# Patient Record
Sex: Female | Born: 1940 | Race: White | Hispanic: No | Marital: Married | Smoking: Never smoker
Health system: Southern US, Community
[De-identification: ages and names within clinical notes are randomized; demographics above are authoritative.]

## PROBLEM LIST (undated history)

## (undated) DIAGNOSIS — Z1211 Encounter for screening for malignant neoplasm of colon: Secondary | ICD-10-CM

## (undated) DIAGNOSIS — M1990 Unspecified osteoarthritis, unspecified site: Secondary | ICD-10-CM

## (undated) DIAGNOSIS — E039 Hypothyroidism, unspecified: Secondary | ICD-10-CM

## (undated) DIAGNOSIS — R0789 Other chest pain: Secondary | ICD-10-CM

## (undated) DIAGNOSIS — E7800 Pure hypercholesterolemia, unspecified: Secondary | ICD-10-CM

## (undated) DIAGNOSIS — K219 Gastro-esophageal reflux disease without esophagitis: Secondary | ICD-10-CM

## (undated) HISTORY — DX: Other chest pain: R07.89

## (undated) HISTORY — DX: Pure hypercholesterolemia, unspecified: E78.00

## (undated) HISTORY — DX: Gastro-esophageal reflux disease without esophagitis: K21.9

## (undated) HISTORY — DX: Hypothyroidism, unspecified: E03.9

## (undated) HISTORY — DX: Encounter for screening for malignant neoplasm of colon: Z12.11

## (undated) HISTORY — DX: Unspecified osteoarthritis, unspecified site: M19.90

---

## 1967-10-15 DIAGNOSIS — Z5189 Encounter for other specified aftercare: Secondary | ICD-10-CM

## 1967-10-15 HISTORY — DX: Encounter for other specified aftercare: Z51.89

## 1994-08-31 ENCOUNTER — Emergency Department: Admit: 1994-08-31 | Payer: Self-pay | Admitting: Family Medicine

## 1994-09-01 ENCOUNTER — Emergency Department: Admit: 1994-09-01 | Payer: Self-pay | Admitting: Family Medicine

## 1996-10-12 ENCOUNTER — Emergency Department: Admit: 1996-10-12 | Payer: Self-pay | Source: Emergency Department | Admitting: Emergency Medicine

## 2001-01-02 ENCOUNTER — Ambulatory Visit: Admission: RE | Admit: 2001-01-02 | Payer: Self-pay | Source: Ambulatory Visit | Admitting: Gastroenterology

## 2006-08-28 ENCOUNTER — Ambulatory Visit: Admission: RE | Admit: 2006-08-28 | Payer: Self-pay | Source: Ambulatory Visit | Admitting: Gastroenterology

## 2011-08-01 NOTE — Op Note (Unsigned)
DATE OF BIRTH:                        07/30/1941      ADMISSION DATE:                     08/28/2006            PATIENT LOCATION:                     END END 39            DATE OF PROCEDURE:                   07/22/2006      SURGEON:                            Rossie Muskrat, MD      ASSISTANT(S):                  PREOPERATIVE DIAGNOSIS:  SCREENING.            POSTOPERATIVE DIAGNOSIS:  HEMORRHOIDS.            PROCEDURE:  COLONOSCOPY.            ANESTHESIA:  Versed 3.5 mg IV, fentanyl 75 mcg IV.            DESCRIPTION OF PROCEDURE:  After obtaining informed consent outlining the      risks, benefits and alternatives, and after digital rectal exam which      revealed no masses, the Olympus video 160 colonoscope was introduced into      the rectum.  Using a detorquing technique, it was advanced to the cecum      with visualization of the appendiceal orifice and ileocecal valve along      with intubation of the terminal ileum. The mucosal surfaces were well seen.      No lesions were noted in the cecum, ascending, transverse, or descending      colon.  A few small diverticula were noted in the sigmoid.  Retroflexion in      the rectal vault revealed hemorrhoids.  The instrument was removed and the      patient tolerated the procedure well.            Colonoscopic withdrawal time was 11 minutes.  A followup colonoscopy is      suggested in 5 years.                                                ___________________________________          Date Signed: __________      Rossie Muskrat, MD  ([id number])            D: 09/16/2006 by Rossie Muskrat, MD      T: 09/17/2006 by [id number] ([id number]) ([id number])      cc:  Rossie Muskrat, MD

## 2011-10-01 NOTE — Op Note (Signed)
MRN: [id number] DOCUMENT ID: [id number]      INTRODUCTION:      70 YEAR OLD FEMALE PATIENT PRESENTS FOR AN ELECTIVE OUTPATIENT      COLONOSCOPY.  THE INDICATION FOR THE PROCEDURE WAS AVERAGE RISK SCREENING      FOR COLON CANCER.      CONSENT:      THE BENEFITS, RISKS, AND ALTERNATIVES TO THE PROCEDURE WERE DISCUSSED AND      INFORMED CONSENT WAS OBTAINED.      PREPARATION:      EKG, PULSE, PULSE OXIMETRY AND BLOOD PRESSURE MONITORED.      MEDICATIONS:      ANESTHESIA-IVA      - MIDAZOLAM HCL 3.5 MG IV THROUGHOUT THE PROCEDURE      - FENTANYL 75 MCG IV THROUGHOUT THE PROCEDURE      PROCEDURE:      RECTAL EXAM: NORMAL SPHINCTER TONE.      THE ENDOSCOPE WAS PASSED WITH EASE UNDER DIRECT VISUALIZATION TO THE      TERMINAL ILEUM CONFIRMED BY LANDMARKS AND APPENDICEAL ORIFICE.      RETROFLEXION WAS PERFORMED IN THE RECTUM.  SCOPE WITHDRAWEL TIME FROM THE      CECUM WAS:9 MINUTES      FINDINGS:  INTERNAL HEMORRHOIDS WERE PRESENT.  THE COLONOSCOPY WAS      OTHERWISE NORMAL.      IMPRESSION:      1.  INTERNAL HEMORRHOIDS WERE PRESENT [455.0].      2.  COLONOSCOPY, OTHERWISE NORMAL.      SIGNING PHYSICIAN: Celedonio Sortino B

## 2012-03-10 ENCOUNTER — Telehealth: Payer: Self-pay

## 2012-03-10 NOTE — Telephone Encounter (Signed)
She called because she saw an [place] ad about BRCA testing in the [facility]. She is 70 and cancer free. Her sisters have been diagnosed with breast cancer at ages 22 and 11. She also has a cousin with pancreatic cancer. I told her that she does not meet medicare's criteria for BRCA testing. According to the [employer] model, her sisters have a 13% risk to have a BRCA mutation. Her sisters' insurance may cover BRCA testing at this risk level. She will talk this over with her sisters and will call back if she has any other questions.

## 2012-10-05 NOTE — Op Note (Unsigned)
DATE OF SURGERY:                    01/02/2001            SURGEON:                            Vernard Gambles, MD            ASSISTANT(S):                  PREOPERATIVE DIAGNOSIS:  CHANGE IN BOWEL HABITS.            POSTOPERATIVE DIAGNOSIS:  NORMAL COLONOSCOPY TO THE TERMINAL ILEUM.            PROCEDURE:  COLONOSCOPY.            MEDICATIONS:  Demerol 50 mg IV, Versed 3 mg IV.            DESCRIPTION OF PROCEDURE:  After informed  consent  was  obtained  from the      patient  including  all  benefits,  risks   and  alternatives  the  patient      understood and desired to proceed.  The  patient  was  placed  in  the left      lateral decubitus position and had introduction  of  pediatric colonoscope.      This was passed to the level of the cecum  with identification of ileocecal      valve and appendiceal orifice.  Pictures were taken.  The cecum, ascending,      transverse, descending and sigmoid and  rectum mucosa appeared normal.  The      rectum appeared normal on forward view and retroflexed view.            IMPRESSION:  Normal colonoscopy to the cecum.            RECOMMENDATION:   Repeat colonoscopy  five  years  time  for  colon  cancer      surveillance.                                                        _____________________________________                                            _____                                            Vernard Gambles, MD      PKG/mdirwm      D: 03/22/200211:12 A      T: 01/02/2001  2:20 P      J: [id number]      N: [id number]      CC: Vernard Gambles, MD         Shanna Cisco, MD

## 2014-08-14 HISTORY — PX: OTHER SURGICAL HISTORY: SHX169

## 2014-09-22 NOTE — Pre-Procedure Instructions (Signed)
   Pt called [phone] with question about eating seeds and nuts, called pt back and referred to surgeon

## 2014-09-23 ENCOUNTER — Ambulatory Visit: Payer: Medicare Other

## 2014-09-23 NOTE — Pre-Procedure Instructions (Signed)
PCP faxed for EKG done 07/2014, no labs ordered or required

## 2014-09-26 NOTE — Anesthesia Preprocedure Evaluation (Addendum)
Anesthesia Evaluation    AIRWAY    Mallampati: II    TM distance: >3 FB  Neck ROM: full  Mouth Opening:full   CARDIOVASCULAR    cardiovascular exam normal       DENTAL    no notable dental hx     PULMONARY    pulmonary exam normal     OTHER FINDINGS                      Anesthesia Plan    ASA 2     general                     Detailed anesthesia plan: general IV            informed consent obtained    ECG reviewed

## 2014-09-26 NOTE — Pre-Procedure Instructions (Signed)
Spoke with Hawkar at [facility] office who will fax ekg to [phone].

## 2014-09-27 ENCOUNTER — Ambulatory Visit: Payer: Medicare Other | Admitting: Anesthesiology

## 2014-09-27 ENCOUNTER — Ambulatory Visit
Admission: RE | Admit: 2014-09-27 | Discharge: 2014-09-27 | Disposition: A | Discharge status: Home / Self Care | Payer: Medicare Other | Source: Ambulatory Visit | Attending: Gastroenterology | Admitting: Gastroenterology

## 2014-09-27 ENCOUNTER — Encounter
Admission: RE | Disposition: A | Discharge status: Home / Self Care | Payer: Self-pay | Source: Ambulatory Visit | Attending: Gastroenterology

## 2014-09-27 ENCOUNTER — Ambulatory Visit: Payer: Medicare Other | Admitting: Gastroenterology

## 2014-09-27 DIAGNOSIS — K5730 Diverticulosis of large intestine without perforation or abscess without bleeding: Secondary | ICD-10-CM | POA: Insufficient documentation

## 2014-09-27 DIAGNOSIS — Z1211 Encounter for screening for malignant neoplasm of colon: Secondary | ICD-10-CM | POA: Insufficient documentation

## 2014-09-27 DIAGNOSIS — K649 Unspecified hemorrhoids: Secondary | ICD-10-CM | POA: Insufficient documentation

## 2014-09-27 HISTORY — PX: COLONOSCOPY: SHX174

## 2014-09-27 SURGERY — DONT USE, USE 1094-COLONOSCOPY, DIAGNOSTIC (SCREENING)
Anesthesia: Anesthesia General | Site: Anus

## 2014-09-27 MED ORDER — DIPHENHYDRAMINE HCL 50 MG/ML IJ SOLN
6.2500 mg | Freq: Four times a day (QID) | INTRAMUSCULAR | Status: DC | PRN
Start: 2014-09-27 — End: 2014-09-27

## 2014-09-27 MED ORDER — PROPOFOL INFUSION 10 MG/ML
INTRAVENOUS | Status: DC | PRN
Start: 2014-09-27 — End: 2014-09-27
  Administered 2014-09-27: 140 ug/kg/min via INTRAVENOUS

## 2014-09-27 MED ORDER — LACTATED RINGERS IV SOLN
INTRAVENOUS | Status: DC
Start: 2014-09-27 — End: 2014-09-27

## 2014-09-27 MED ORDER — LIDOCAINE HCL 2 % IJ SOLN
INTRAMUSCULAR | Status: DC | PRN
Start: 2014-09-27 — End: 2014-09-27
  Administered 2014-09-27: 40 mg

## 2014-09-27 MED ORDER — MEPERIDINE HCL 25 MG/ML IJ SOLN
25.0000 mg | INTRAMUSCULAR | Status: DC | PRN
Start: 2014-09-27 — End: 2014-09-27

## 2014-09-27 MED ORDER — PROPOFOL INFUSION 10 MG/ML
INTRAVENOUS | Status: DC | PRN
Start: 2014-09-27 — End: 2014-09-27
  Administered 2014-09-27: 50 mg via INTRAVENOUS

## 2014-09-27 MED ORDER — PROMETHAZINE HCL 25 MG/ML IJ SOLN
6.2500 mg | Freq: Once | INTRAMUSCULAR | Status: DC | PRN
Start: 2014-09-27 — End: 2014-09-27

## 2014-09-27 MED ORDER — ONDANSETRON HCL 4 MG/2ML IJ SOLN
4.0000 mg | Freq: Once | INTRAMUSCULAR | Status: DC | PRN
Start: 2014-09-27 — End: 2014-09-27

## 2014-09-27 SURGICAL SUPPLY — 20 items
CONTAINER SPEC LLDPE 16OZ W LF NS LEK (Procedure Accessories)
CONTAINER SPECIMAN 16OZ W LID (Procedure Accessories)
CONTAINER SPECIMEN C16 OZ WIDE LEAK SHATTER RESISTANT SNAP ON LID (Procedure Accessories) IMPLANT
FORCEP BIOPSY 240CM RADIAL JA (Instrument) IMPLANT
FORCEP BIOPSY 240CM RADIALJAW (Instrument) IMPLANT
GAUZE SPONGE 4X4 NS (Dressing) ×5
GLOVE NITRILE PREMIERPRO MED (Glove) ×2 IMPLANT
GOWN CP ELSTC WRIST REG/LG BL (Gown) ×1
GOWN ISL PP PE REG LG LF FULL BCK NK TIE (Gown) ×1
GOWN ISOLATION REGULAR LARGE FULL BACK NECK TIE ELASTIC CUFF (Gown) ×1 IMPLANT
JELLY KY LUBRICATNG 2 OZ FLIP (Procedure Accessories) ×2 IMPLANT
PAD ELECTROSRG GRND REM W CRD (Procedure Accessories) IMPLANT
SPONGE GAUZE L4 IN X W4 IN 16 PLY (Dressing) ×5
SPONGE GAUZE L4 IN X W4 IN 16 PLY MAXIMUM ABSORBENT USP TYPE VII (Dressing) ×5 IMPLANT
SYRINGE 50 ML GRADUATE NONPYROGENIC DEHP (Syringes, Needles) ×1
SYRINGE 50 ML GRADUATE NONPYROGENIC DEHP FREE PVC FREE BD MEDICAL (Syringes, Needles) ×1 IMPLANT
SYRINGE SLIP-TIP 60CC (Syringes, Needles) ×1
TUBING CONNECTING STERILE 10FT (Tubing) ×1
TUBING SUCTION ID3/16 IN L10 FT (Tubing) ×1
TUBING SUCTION ID3/16 IN L10 FT NONCONDUCTIVE STRAIGHT MALE FEMALE (Tubing) ×1 IMPLANT

## 2014-09-27 NOTE — Op Note (Signed)
GI PRE PROCEDURE NOTE    Proceduralist Comments:   Review of Systems and Past Medical / Surgical History performed: Yes     Indications:Colon cancer screening    Previous Adverse Reaction to Anesthesia or Sedation (if yes, describe): No    Physical Exam / Laboratory Data (If applicable)   Airway Classification: Class II    General: Alert and cooperative  Lungs: Lungs clear to auscultation  Cardiac: RRR, normal S1S2.    Abdomen: Soft, non tender. Normal active bowel sounds  Other:     No labs drawn    American Society of Anesthesiologists (ASA) Physical Status Classification:   ASA 2 - Patient with mild systemic disease with no functional limitations    Planned Sedation:   Deep sedation with anesthesia    Attestation:   Cristela Felt has been reassessed immediately prior to the procedure and is an appropriate candidate for the planned sedation and procedure. Risks, benefits and alternatives to the planned procedure and sedation have been explained to the patient or guardian:  yes        Signed by: Rossie Muskrat

## 2014-09-27 NOTE — Discharge Instructions (Signed)
Colonoscopy Discharge Instructions  General Instructions:  1. Following sedation, your judgement, perception, and coordination are considered impaired. Even though you may feel awake and alert, you are considered legally intoxicated. Therefore, until the next morning;   Do not Drive   Do not operate appliances or equipment that requires reaction time (e.g.Stove, electrical tools, machinery)   Do not sign legal documents or be involved in important decisions.   Do not smoke if alone   Do not drink alcoholic beverages   Go directly home and rest for several hours before resuming your routine activities.   It is highly recommended to have a responsible adult stay with you for the next 24 hours    2. Tenderness, swelling or pain may occur at the IV site where you received sedation. If you experience this, apply warm soaks to the area. Notify your physician if this persists.    Instructions Specific To Procedures - Report To Physician Any Of The Following:    Colon/Sigmoidoscopy/Proctoscopy   1. Severe and persistent abdominal pain/bloating which does not subside within 2-3 hours   2. Large amount of rectal bleeding (some mucosal blood streaking may occur, especially if biopsy or polypectomy was done or if hemorrhoids are present.   3. Nausea/vomitting   4. Fevers/Chills within 24 hours after procedure. Temp>101deg F     In Addition:   If polyp has been removed, DO NOT take aspirin or aspirin containing products (e.g. Anacin, Alka Seltzer, Bufferin, Etc.) or non-steroidal anti-inflammatory drugs (e.g. Advil, Motrin, etc.) for *** days unless otherwise advised by doctor. Tylenol  or extra Strength Tylenol is permitted.    Additional Discharge Instructions  Your diet after the procedure:  Start with something light (Toast, Jello, Soup, Etc.), Then Resume to Regular Diet as Tolerated. Nothing Spicy, Greasy or [employer] Today.  If you have questions or problems contact your MD immediately. If you need immediate  attention, call your MD, 911 and/or go to nearest emergency room.      Understanding Diverticulosis and Diverticulitis  The colon (large intestine) is the last part of the digestive tract. It absorbs water from stool and changes it from a liquid to a solid. In certain cases, small pouches called diverticula can form in the colon wall. This condition is called diverticulosis. The pouches can become infected. If this happens, it becomes a more serious problem called diverticulitis. These problems can be painful. But they can be managed.    Managing your condition  Diet changes or medications may be prescribed.    If you have diverticulosis  2. Diet changes are often enough to control symptoms. The main changes are adding fiber (roughage) and drinking more water. Fiber absorbs water as it travels through your colon. This helps your stool stay soft and move smoothly. Water helps this process.  3. If needed, you may be told to take over-the-counter stool softeners.  4. To help relieve pain, antispasmodic medications may be prescribed.  5. Watch for changes in your bowel movements. Tell the doctor if you notice any changes.  6. Begin an exercise program. Ask your doctor how to get started.  7. Get plenty of rest and sleep.  If you have diverticulitis  Treatment depends on how bad your symptoms are.   For mild symptoms. You may be put on a liquid diet for a short time. Antibiotics are usually prescribed. If these2 steps relieve your symptoms, you may then be prescribed a high-fiber diet. If you still have symptoms,  your doctor will discuss further treatment options with you.   For severe symptoms. You may need to be admitted to the hospital. There, you can be given IV antibiotics and fluids. You will also be put on a low-fiber or liquid diet. Although not common, surgery is needed in some people with severe symptoms.  Keys to colon health  Help keep your colon healthy with a diet that includes plenty of high-fiber  fruits, vegetables, and whole grains. Drink plenty of liquids like water and juice.   [address] [employer] [address], [place], [place] [address]. All rights reserved. This information is not intended as a substitute for professional medical care. Always follow your healthcare professional's instructions.      Hemorrhoids,External    A hemorrhoid is a local swelling of the veins around the rectum. These most often occur from repeated forceful straining during bowel movements or heavy lifting. It may also occur in the last few months of pregnancy. A hemorrhoid feels like a soft lump. It may itch from time to time. When it is inflamed it becomes hard and very painful.  Home Care:  8. SITZ BATHS: Sit in a tub filled with about 6 inches of hot water. Allow the water to run in order to keep it hot for a total of 10-15 minutes. Repeat this three times a day until pain is relieved.  9. Keep your stools soft to avoid the need to strain when having a bowel movement. Unless another medicine was prescribed, try the following:  IF YOU ARE CONSTIPATED: You may use over-the-counter laxatives such as MILK OF MAGNESIA (mild acting) or, DULCOLAX (if stronger action is needed).  IF YOU ARE NOT CONSTIPATED but stools are hard, try taking Colace (docusate sodium) which is a stool softener. This will soften stools without producing diarrhea. Drinking extra fluids may also help.  10. The use of creams applied to the hemorrhoid itself, such as ANUSOL or PREPARATION H, will be helpful to reduce pain and itching, and speed healing.  Prevention:  Avoid straining on the toilet by keeping stools soft. Increasing FIBER in your diet (fruits, cereals, vegetables and grains) will promote healthy bowel movement. If this is not working, you may use METAMUCIL and similar products. These are over-the-counter fiber supplements. You must drink extra fluids when taking these to avoid constipation.  Follow Up  with your doctor if you  do not begin to respond to the above treatment within the next few days.  Get Prompt Medical Attention  if any of the following occur:   Large amount of rectal bleeding (more than 1 cup of blood in 24 hours)   Increasing rectal pain or rectal pain that continues for more than three days of treatment   Weakness, dizziness or fainting   Vomiting blood (red or black color)   2000-2015 [employer] [address] [place], [place] [address]. All rights reserved. This information is not intended as a substitute for professional medical care. Always follow your healthcare professional's instructions.      Understanding Colon and Rectal Polyps  The colon (also called the large intestine) is a muscular tube that forms the last part of the digestive tract. It absorbs water and stores food waste. The colon is about 4 to 6 feet long. The rectum is the last 6 inches of the colon. The colon and rectum have a smooth lining composed of millions of cells. Changes in these cells can lead to growths  in the colon that can become cancerous and should be removed.  When the Colon Lining Changes  Changes that occur in the cells that line the colon or rectum can lead to growths called polyps. Over a period of years, polyps can turn cancerous. Removing polyps early may prevent cancer from ever forming.     Polyps  Polyps are fleshy clumps of tissue that form on the lining of the colon or rectum. Small polyps are usually benign (not cancerous). However, over time, cells in a polyp can change and become cancerous. Certain types of polyps known as adenomatous polyps are premalignant. The risk for invasive cancer increases with the size of the polyp and certain cell and gene features. This means that they can become cancerous if they're not removed.Hyperplastic polyps are benign. They can grow quite large and not turn cancerous.  Cancer  Almost all colorectal cancers start when polyp cells begin growing abnormally. As a  cancerous tumor grows, it may involve more and more of the colon or rectum. In time, cancer can also grow beyond the colon or rectum and spread to nearby organs or to glands called lymph nodes. The cells can also travel to other parts of the body. This is known as metastasis. The earlier a cancerous tumor is removed, the better the chance of preventing its spread.   [address] [employer] [address], [place], [place] [address]. All rights reserved. This information is not intended as a substitute for professional medical care. Always follow your healthcare professional's instructions.

## 2014-09-27 NOTE — Transfer of Care (Signed)
Anesthesia Transfer of Care Note    Patient: Tracy Ritter    Procedures performed: Procedure(s) with comments:  COLONOSCOPY - COLONOSCOPY    Anesthesia type: General TIVA    Patient location:Phase II PACU    Last vitals:   Filed Vitals:    09/27/14 1254   BP: 130/59   Pulse: 66   Temp: 37.2 C (99 F)   Resp: 16       Post pain: Patient not complaining of pain, continue current therapy      Mental Status:awake    Respiratory Function: tolerating room air    Cardiovascular: stable    Nausea/Vomiting: patient not complaining of nausea or vomiting    Hydration Status: adequate    Post assessment: no apparent anesthetic complications

## 2014-09-27 NOTE — Anesthesia Postprocedure Evaluation (Signed)
Anesthesia Post Evaluation    Patient: Tracy Ritter    Procedures performed: Procedure(s) with comments:  COLONOSCOPY - COLONOSCOPY    Anesthesia type: General TIVA    Patient location:Phase II PACU    Last vitals:   Filed Vitals:    09/27/14 1254   BP: 130/59   Pulse: 66   Temp: 37.2 C (99 F)   Resp: 16       Post pain: Patient not complaining of pain, continue current therapy      Mental Status:awake and alert     Respiratory Function: tolerating room air    Cardiovascular: stable    Nausea/Vomiting: patient not complaining of nausea or vomiting    Hydration Status: adequate    Post assessment: no apparent anesthetic complications

## 2014-09-28 ENCOUNTER — Encounter: Payer: Self-pay | Admitting: Gastroenterology

## 2014-09-28 NOTE — Op Note (Signed)
Procedure Date: 09/27/2014     Patient Type: A     SURGEON: Rossie Muskrat MD  ASSISTANT:       PREOPERATIVE DIAGNOSIS:  Screening.     POSTOPERATIVE DIAGNOSES:  1.  Diverticulosis, sigmoid colon.  2.  Hemorrhoids.     TITLE OF PROCEDURE:  Colonoscopy.     ANESTHESIA:  LMAC.     DESCRIPTION OF PROCEDURE:  After obtaining informed consent, outlining risks, benefits and  alternatives and after digital exam which revealed no masses, the Olympus  video 180 colonoscope was introduced into the rectum with detorquing  technique was advanced to the cecum with visualization of the appendiceal  orifice and ileocecal valve with glimpse of the ileum obtained.  The  ascending colon was traversed x2.  Findings were that of a normal cecum,  ascending, transverse, and descending colon.  There was some angulation in  the sigmoid with a few diverticula.  Retroflexion in the rectal vault  revealed hemorrhoids.  The instrument was removed and the patient tolerated  the procedure well.     RECOMMENDATIONS:  1.  A 25 gram fiber diet.  2.  Followup colonoscopy in ten years.           D:  09/27/2014 13:54 PM by Dr. Vicente Serene B. Chase Picket, MD [phone])  T:  09/27/2014 20:44 PM by       Everlean Cherry: [id number]) (Doc ID: [id number])

## 2015-06-06 DIAGNOSIS — Z961 Presence of intraocular lens: Secondary | ICD-10-CM | POA: Diagnosis not present

## 2015-06-06 DIAGNOSIS — H40013 Open angle with borderline findings, low risk, bilateral: Secondary | ICD-10-CM | POA: Diagnosis not present

## 2015-06-07 DIAGNOSIS — Z124 Encounter for screening for malignant neoplasm of cervix: Secondary | ICD-10-CM | POA: Diagnosis not present

## 2015-07-11 DIAGNOSIS — R070 Pain in throat: Secondary | ICD-10-CM | POA: Diagnosis not present

## 2015-07-11 DIAGNOSIS — J019 Acute sinusitis, unspecified: Secondary | ICD-10-CM | POA: Diagnosis not present

## 2015-08-01 DIAGNOSIS — E039 Hypothyroidism, unspecified: Secondary | ICD-10-CM | POA: Diagnosis not present

## 2015-08-01 DIAGNOSIS — D693 Immune thrombocytopenic purpura: Secondary | ICD-10-CM | POA: Diagnosis not present

## 2015-08-01 DIAGNOSIS — Z1231 Encounter for screening mammogram for malignant neoplasm of breast: Secondary | ICD-10-CM | POA: Diagnosis not present

## 2015-08-01 DIAGNOSIS — Z0000 Encounter for general adult medical examination without abnormal findings: Secondary | ICD-10-CM | POA: Diagnosis not present

## 2015-08-01 DIAGNOSIS — E782 Mixed hyperlipidemia: Secondary | ICD-10-CM | POA: Diagnosis not present

## 2015-08-01 DIAGNOSIS — Z0001 Encounter for general adult medical examination with abnormal findings: Secondary | ICD-10-CM | POA: Diagnosis not present

## 2015-08-01 DIAGNOSIS — Z719 Counseling, unspecified: Secondary | ICD-10-CM | POA: Diagnosis not present

## 2015-08-01 DIAGNOSIS — Z1389 Encounter for screening for other disorder: Secondary | ICD-10-CM | POA: Diagnosis not present

## 2015-08-28 DIAGNOSIS — L821 Other seborrheic keratosis: Secondary | ICD-10-CM | POA: Diagnosis not present

## 2015-08-28 DIAGNOSIS — D2371 Other benign neoplasm of skin of right lower limb, including hip: Secondary | ICD-10-CM | POA: Diagnosis not present

## 2015-11-22 DIAGNOSIS — J019 Acute sinusitis, unspecified: Secondary | ICD-10-CM | POA: Diagnosis not present

## 2015-11-22 DIAGNOSIS — R070 Pain in throat: Secondary | ICD-10-CM | POA: Diagnosis not present

## 2015-11-28 ENCOUNTER — Other Ambulatory Visit: Payer: Self-pay | Admitting: Family Medicine

## 2015-11-28 DIAGNOSIS — J9811 Atelectasis: Secondary | ICD-10-CM | POA: Diagnosis not present

## 2015-11-28 DIAGNOSIS — R05 Cough: Secondary | ICD-10-CM | POA: Diagnosis not present

## 2016-04-10 ENCOUNTER — Encounter (INDEPENDENT_AMBULATORY_CARE_PROVIDER_SITE_OTHER): Payer: Self-pay | Admitting: Family Medicine

## 2016-04-10 ENCOUNTER — Ambulatory Visit (INDEPENDENT_AMBULATORY_CARE_PROVIDER_SITE_OTHER): Payer: Medicare Other

## 2016-04-10 ENCOUNTER — Ambulatory Visit (INDEPENDENT_AMBULATORY_CARE_PROVIDER_SITE_OTHER): Payer: Medicare Other | Admitting: Family Medicine

## 2016-04-10 VITALS — BP 125/74 | HR 63 | Temp 98.0°F | Resp 15 | Ht 63.0 in | Wt 135.0 lb

## 2016-04-10 DIAGNOSIS — S99911A Unspecified injury of right ankle, initial encounter: Secondary | ICD-10-CM

## 2016-04-10 NOTE — Progress Notes (Signed)
[facility]  PROGRESS NOTE     Patient: Tracy Ritter   Date: 04/10/2016   MRN: [id number]       Tracy Ritter is a 75 y.o. female      SUBJECTIVE     Chief Complaint   Patient presents with   . Ankle Injury     Pt fell today at the pool and hurt right ankle at 5:15pm. Pt has right ankle on ice and she is currently in a wheelchair. She said she also tried to keep her right ankle elevated to releive pain. Pt last took Advil 1 hour prior to arrival.          Ankle Injury   The incident occurred 1 to 3 hours ago. The incident occurred at the pool. The injury mechanism was a fall. The pain is present in the right ankle. The quality of the pain is described as aching and shooting. The pain is at a severity of 6/10. The pain has been constant since onset. Pertinent negatives include no numbness or tingling. She reports no foreign bodies present. The symptoms are aggravated by weight bearing, palpation and movement. She has tried ice and NSAIDs for the symptoms. The treatment provided no relief.       Review of Systems   Constitutional: Negative for fever and chills.   Respiratory: Negative for shortness of breath.    Cardiovascular: Negative for chest pain.   Neurological: Negative for tingling and numbness.       The following portions of the patient's history were reviewed and updated as appropriate: Allergies, Current Medications, Past Family History, Past Medical history, Past social history, Past surgical history, and Problem List.    OBJECTIVE     Vitals   Filed Vitals:    04/10/16 1852   BP: 125/74   Pulse: 63   Temp: 98 F (36.7 C)   TempSrc: Tympanic   Resp: 15   Height: 1.6 m (5\' 3" )   Weight: 61.236 kg (135 lb)   SpO2: 97%       Physical Exam   Constitutional: She appears well-developed and well-nourished.   Eyes: Conjunctivae are normal.   Musculoskeletal:        Right knee: Normal.        Right ankle: She exhibits decreased range of motion and swelling. Tenderness. Lateral malleolus and medial  malleolus tenderness found. Achilles tendon normal.        Right lower leg: Normal.        Right foot: Normal.   Skin: Skin is warm and dry.       Lab Results (24 Hour)   Results     ** No results found for the last 24 hours. **          Radiology Results (24 Hour)     Procedure Component Value Units Date/Time    X-ray Ankle Right  3 Views [[id number]] Collected:  04/10/16 1919    Order Status:  Completed Updated:  04/10/16 1924    Narrative:      INDICATION:  Right ankle injury.    TECHNIQUE:  3 views    COMPARISON:No relevant prior studies were available for review.    FINDINGS:  There is soft tissue swelling over the lateral malleolus.  There is a suspicion for a small nondisplaced fracture through the  lateral malleolus. No additional bony abnormalities noted. No joint  space abnormalities evident.      Impression:  Soft tissue ligamentous injury and a questionable  nondisplaced fracture through the lateral malleolus.    Lorenda Peck, MD   04/10/2016 7:20 PM            ASSESSMENT     Encounter Diagnosis   Name Primary?   . Right ankle injury, initial encounter Yes          PLAN     Procedures    1. Right ankle injury, initial encounter  - X-ray Ankle Right  3 Views  - Task for Ace Wrap (3 or 4 IN)  - Ambulatory referral to Orthopedic Surgery  - Task for Air Stirrup  RICE  -Patient to follow up with primary care doctor or follow up here if symptoms worsening or not resolving as expected.   -Follow up with ED with acute worsening of symptoms.  -Patient verbalized understanding.   -Reviewed discharge instructions that are included here with patient, and printed in AVS.   -Questions answered.  Risks and Benefits of Therapy Discussed      An After Visit Summary was printed and given to the patient.      Signed,  Britta Mccreedy, MD  04/10/2016

## 2016-04-10 NOTE — Patient Instructions (Signed)
Treating Ankle Fractures  Treatment of an ankle fracture may be surgical or non-surgical, depending on where and how badly your ankle has been broken.  Some stable ankle fractures may be treated in a walking boot. These fractures are stable and will heal without additional treatment. You may be able to start walking on your ankle as soon as the pain improves.  Some fractures may require cast treatment.  A cast may be used to hold the broken bone in its proper position for healing. Sometimes the sections of broken bone must first be realigned. This is done by a process known as reduction. The type of reduction is based on how far the bone has moved from its normal position.     Sites of common ankle fractures    Closed reduction  If you have a clean break with little soft tissue damage, closed reduction will probably be used. Before the procedure, you may be given a light anesthetic to relax your muscles. Then your doctor manually readjusts the position of the broken bone.  Open reduction  If you have an open fracture (bone sticking out through the skin), badly misaligned sections of bone, or severe tissue injury, open reduction is likely. A general anesthetic may be used during the procedure to let you sleep and relax your muscles. Your doctor then makes one or more incisions to realign the bone and repair soft tissue. Screws or plates may be used to hold the bone in place during healing.    Casting the fracture  To make sure the bone is aligned properly, an X-ray is taken. Then the ankle is put in a cast to hold the bone in place during healing. You'll probably have to wear the cast for severalweeks. For less severe fractures, a walking boot, brace, or splint may be all that's needed to hold the bone in place during healing.  The road to healing  Once your fracture has been treated, your doctor will tell you how to help it heal. You may be told to limit ankle use or weight-bearing activities, take medicines,  and elevate the foot. If you have a cast, remember to keep it dry.   Date Last Reviewed: 06/22/2014   2000-2016 [employer] [address] [place], [place] [address]. All rights reserved. This information is not intended as a substitute for professional medical care. Always follow your healthcare professional's instructions.

## 2016-04-11 DIAGNOSIS — M159 Polyosteoarthritis, unspecified: Secondary | ICD-10-CM | POA: Diagnosis not present

## 2016-04-11 DIAGNOSIS — E538 Deficiency of other specified B group vitamins: Secondary | ICD-10-CM | POA: Diagnosis not present

## 2016-04-11 DIAGNOSIS — E039 Hypothyroidism, unspecified: Secondary | ICD-10-CM | POA: Diagnosis not present

## 2016-04-11 DIAGNOSIS — R413 Other amnesia: Secondary | ICD-10-CM | POA: Diagnosis not present

## 2016-04-11 DIAGNOSIS — E782 Mixed hyperlipidemia: Secondary | ICD-10-CM | POA: Diagnosis not present

## 2016-04-11 DIAGNOSIS — S8290XA Unspecified fracture of unspecified lower leg, initial encounter for closed fracture: Secondary | ICD-10-CM | POA: Diagnosis not present

## 2016-04-12 DIAGNOSIS — M2012 Hallux valgus (acquired), left foot: Secondary | ICD-10-CM | POA: Diagnosis not present

## 2016-04-12 DIAGNOSIS — M205X9 Other deformities of toe(s) (acquired), unspecified foot: Secondary | ICD-10-CM | POA: Diagnosis not present

## 2016-04-12 DIAGNOSIS — S93401A Sprain of unspecified ligament of right ankle, initial encounter: Secondary | ICD-10-CM | POA: Diagnosis not present

## 2016-04-12 DIAGNOSIS — M2011 Hallux valgus (acquired), right foot: Secondary | ICD-10-CM | POA: Diagnosis not present

## 2016-05-06 DIAGNOSIS — S93401D Sprain of unspecified ligament of right ankle, subsequent encounter: Secondary | ICD-10-CM | POA: Diagnosis not present

## 2016-05-06 DIAGNOSIS — M25571 Pain in right ankle and joints of right foot: Secondary | ICD-10-CM | POA: Diagnosis not present

## 2016-05-14 DIAGNOSIS — M6281 Muscle weakness (generalized): Secondary | ICD-10-CM | POA: Diagnosis not present

## 2016-05-14 DIAGNOSIS — R2689 Other abnormalities of gait and mobility: Secondary | ICD-10-CM | POA: Diagnosis not present

## 2016-05-14 DIAGNOSIS — M25571 Pain in right ankle and joints of right foot: Secondary | ICD-10-CM | POA: Diagnosis not present

## 2016-05-14 DIAGNOSIS — S93401D Sprain of unspecified ligament of right ankle, subsequent encounter: Secondary | ICD-10-CM | POA: Diagnosis not present

## 2016-05-15 DIAGNOSIS — R2689 Other abnormalities of gait and mobility: Secondary | ICD-10-CM | POA: Diagnosis not present

## 2016-05-15 DIAGNOSIS — S93401D Sprain of unspecified ligament of right ankle, subsequent encounter: Secondary | ICD-10-CM | POA: Diagnosis not present

## 2016-05-15 DIAGNOSIS — M25571 Pain in right ankle and joints of right foot: Secondary | ICD-10-CM | POA: Diagnosis not present

## 2016-05-15 DIAGNOSIS — M6281 Muscle weakness (generalized): Secondary | ICD-10-CM | POA: Diagnosis not present

## 2016-05-16 DIAGNOSIS — H52223 Regular astigmatism, bilateral: Secondary | ICD-10-CM | POA: Diagnosis not present

## 2016-05-16 DIAGNOSIS — H40013 Open angle with borderline findings, low risk, bilateral: Secondary | ICD-10-CM | POA: Diagnosis not present

## 2016-05-16 DIAGNOSIS — Z961 Presence of intraocular lens: Secondary | ICD-10-CM | POA: Diagnosis not present

## 2016-05-16 DIAGNOSIS — Z83518 Family history of other specified eye disorder: Secondary | ICD-10-CM | POA: Diagnosis not present

## 2016-05-24 DIAGNOSIS — R2689 Other abnormalities of gait and mobility: Secondary | ICD-10-CM | POA: Diagnosis not present

## 2016-05-24 DIAGNOSIS — M6281 Muscle weakness (generalized): Secondary | ICD-10-CM | POA: Diagnosis not present

## 2016-05-24 DIAGNOSIS — S93401D Sprain of unspecified ligament of right ankle, subsequent encounter: Secondary | ICD-10-CM | POA: Diagnosis not present

## 2016-05-24 DIAGNOSIS — M25571 Pain in right ankle and joints of right foot: Secondary | ICD-10-CM | POA: Diagnosis not present

## 2016-05-27 DIAGNOSIS — M6281 Muscle weakness (generalized): Secondary | ICD-10-CM | POA: Diagnosis not present

## 2016-05-27 DIAGNOSIS — S93401D Sprain of unspecified ligament of right ankle, subsequent encounter: Secondary | ICD-10-CM | POA: Diagnosis not present

## 2016-05-27 DIAGNOSIS — M25571 Pain in right ankle and joints of right foot: Secondary | ICD-10-CM | POA: Diagnosis not present

## 2016-05-27 DIAGNOSIS — R2689 Other abnormalities of gait and mobility: Secondary | ICD-10-CM | POA: Diagnosis not present

## 2016-05-30 DIAGNOSIS — S93401D Sprain of unspecified ligament of right ankle, subsequent encounter: Secondary | ICD-10-CM | POA: Diagnosis not present

## 2016-05-30 DIAGNOSIS — M6281 Muscle weakness (generalized): Secondary | ICD-10-CM | POA: Diagnosis not present

## 2016-05-30 DIAGNOSIS — M25571 Pain in right ankle and joints of right foot: Secondary | ICD-10-CM | POA: Diagnosis not present

## 2016-05-30 DIAGNOSIS — R2689 Other abnormalities of gait and mobility: Secondary | ICD-10-CM | POA: Diagnosis not present

## 2016-06-03 DIAGNOSIS — S93401D Sprain of unspecified ligament of right ankle, subsequent encounter: Secondary | ICD-10-CM | POA: Diagnosis not present

## 2016-07-09 DIAGNOSIS — Z23 Encounter for immunization: Secondary | ICD-10-CM | POA: Diagnosis not present

## 2017-01-06 ENCOUNTER — Other Ambulatory Visit: Payer: Self-pay | Admitting: Internal Medicine

## 2017-01-06 DIAGNOSIS — Z1231 Encounter for screening mammogram for malignant neoplasm of breast: Secondary | ICD-10-CM

## 2017-02-05 DIAGNOSIS — J0140 Acute pansinusitis, unspecified: Secondary | ICD-10-CM | POA: Diagnosis not present

## 2017-02-07 ENCOUNTER — Ambulatory Visit: Payer: Self-pay

## 2017-02-17 DIAGNOSIS — J302 Other seasonal allergic rhinitis: Secondary | ICD-10-CM | POA: Diagnosis not present

## 2017-02-17 DIAGNOSIS — Z6824 Body mass index (BMI) 24.0-24.9, adult: Secondary | ICD-10-CM | POA: Diagnosis not present

## 2017-02-17 DIAGNOSIS — E038 Other specified hypothyroidism: Secondary | ICD-10-CM | POA: Diagnosis not present

## 2017-02-17 DIAGNOSIS — R42 Dizziness and giddiness: Secondary | ICD-10-CM | POA: Diagnosis not present

## 2017-02-17 DIAGNOSIS — E784 Other hyperlipidemia: Secondary | ICD-10-CM | POA: Diagnosis not present

## 2017-02-17 DIAGNOSIS — Z1389 Encounter for screening for other disorder: Secondary | ICD-10-CM | POA: Diagnosis not present

## 2017-02-19 ENCOUNTER — Ambulatory Visit
Admission: RE | Admit: 2017-02-19 | Discharge: 2017-02-19 | Disposition: A | Discharge status: Home / Self Care | Payer: Medicare Other | Source: Ambulatory Visit | Attending: Internal Medicine | Admitting: Internal Medicine

## 2017-02-19 DIAGNOSIS — Z1231 Encounter for screening mammogram for malignant neoplasm of breast: Secondary | ICD-10-CM | POA: Diagnosis not present

## 2017-03-21 DIAGNOSIS — E038 Other specified hypothyroidism: Secondary | ICD-10-CM | POA: Diagnosis not present

## 2017-03-21 DIAGNOSIS — R8290 Unspecified abnormal findings in urine: Secondary | ICD-10-CM | POA: Diagnosis not present

## 2017-03-21 DIAGNOSIS — Z79899 Other long term (current) drug therapy: Secondary | ICD-10-CM | POA: Diagnosis not present

## 2017-03-21 DIAGNOSIS — E784 Other hyperlipidemia: Secondary | ICD-10-CM | POA: Diagnosis not present

## 2017-03-25 DIAGNOSIS — Z1212 Encounter for screening for malignant neoplasm of rectum: Secondary | ICD-10-CM | POA: Diagnosis not present

## 2017-03-27 DIAGNOSIS — E784 Other hyperlipidemia: Secondary | ICD-10-CM | POA: Diagnosis not present

## 2017-03-27 DIAGNOSIS — Z6824 Body mass index (BMI) 24.0-24.9, adult: Secondary | ICD-10-CM | POA: Diagnosis not present

## 2017-03-27 DIAGNOSIS — Z0000 Encounter for general adult medical examination without abnormal findings: Secondary | ICD-10-CM | POA: Diagnosis not present

## 2017-03-27 DIAGNOSIS — E038 Other specified hypothyroidism: Secondary | ICD-10-CM | POA: Diagnosis not present

## 2017-03-27 DIAGNOSIS — R42 Dizziness and giddiness: Secondary | ICD-10-CM | POA: Diagnosis not present

## 2017-06-06 DIAGNOSIS — L918 Other hypertrophic disorders of the skin: Secondary | ICD-10-CM | POA: Diagnosis not present

## 2017-06-06 DIAGNOSIS — D1801 Hemangioma of skin and subcutaneous tissue: Secondary | ICD-10-CM | POA: Diagnosis not present

## 2017-06-06 DIAGNOSIS — D2372 Other benign neoplasm of skin of left lower limb, including hip: Secondary | ICD-10-CM | POA: Diagnosis not present

## 2017-06-06 DIAGNOSIS — L814 Other melanin hyperpigmentation: Secondary | ICD-10-CM | POA: Diagnosis not present

## 2017-06-06 DIAGNOSIS — D2272 Melanocytic nevi of left lower limb, including hip: Secondary | ICD-10-CM | POA: Diagnosis not present

## 2017-06-06 DIAGNOSIS — L821 Other seborrheic keratosis: Secondary | ICD-10-CM | POA: Diagnosis not present

## 2017-06-06 DIAGNOSIS — Z419 Encounter for procedure for purposes other than remedying health state, unspecified: Secondary | ICD-10-CM | POA: Diagnosis not present

## 2017-07-01 DIAGNOSIS — Z23 Encounter for immunization: Secondary | ICD-10-CM | POA: Diagnosis not present

## 2017-09-17 DIAGNOSIS — Z6825 Body mass index (BMI) 25.0-25.9, adult: Secondary | ICD-10-CM | POA: Diagnosis not present

## 2017-09-17 DIAGNOSIS — E7849 Other hyperlipidemia: Secondary | ICD-10-CM | POA: Diagnosis not present

## 2017-09-17 DIAGNOSIS — J302 Other seasonal allergic rhinitis: Secondary | ICD-10-CM | POA: Diagnosis not present

## 2017-09-17 DIAGNOSIS — H8190 Unspecified disorder of vestibular function, unspecified ear: Secondary | ICD-10-CM | POA: Diagnosis not present

## 2017-09-17 DIAGNOSIS — E038 Other specified hypothyroidism: Secondary | ICD-10-CM | POA: Diagnosis not present

## 2017-10-31 DIAGNOSIS — H40013 Open angle with borderline findings, low risk, bilateral: Secondary | ICD-10-CM | POA: Diagnosis not present

## 2017-10-31 DIAGNOSIS — H524 Presbyopia: Secondary | ICD-10-CM | POA: Diagnosis not present

## 2018-01-16 ENCOUNTER — Other Ambulatory Visit: Payer: Self-pay | Admitting: Internal Medicine

## 2018-01-16 DIAGNOSIS — Z1231 Encounter for screening mammogram for malignant neoplasm of breast: Secondary | ICD-10-CM

## 2018-02-20 ENCOUNTER — Ambulatory Visit
Admission: RE | Admit: 2018-02-20 | Discharge: 2018-02-20 | Disposition: A | Discharge status: Home / Self Care | Payer: Medicare Other | Source: Ambulatory Visit | Attending: Internal Medicine | Admitting: Internal Medicine

## 2018-02-20 ENCOUNTER — Encounter: Payer: Self-pay | Admitting: Radiology

## 2018-02-20 DIAGNOSIS — Z1231 Encounter for screening mammogram for malignant neoplasm of breast: Secondary | ICD-10-CM | POA: Diagnosis not present

## 2018-03-30 DIAGNOSIS — R82998 Other abnormal findings in urine: Secondary | ICD-10-CM | POA: Diagnosis not present

## 2018-03-30 DIAGNOSIS — E7849 Other hyperlipidemia: Secondary | ICD-10-CM | POA: Diagnosis not present

## 2018-03-30 DIAGNOSIS — E038 Other specified hypothyroidism: Secondary | ICD-10-CM | POA: Diagnosis not present

## 2018-04-02 DIAGNOSIS — Z1212 Encounter for screening for malignant neoplasm of rectum: Secondary | ICD-10-CM | POA: Diagnosis not present

## 2018-04-06 DIAGNOSIS — Z6825 Body mass index (BMI) 25.0-25.9, adult: Secondary | ICD-10-CM | POA: Diagnosis not present

## 2018-04-06 DIAGNOSIS — Z1389 Encounter for screening for other disorder: Secondary | ICD-10-CM | POA: Diagnosis not present

## 2018-04-06 DIAGNOSIS — J302 Other seasonal allergic rhinitis: Secondary | ICD-10-CM | POA: Diagnosis not present

## 2018-04-06 DIAGNOSIS — R2689 Other abnormalities of gait and mobility: Secondary | ICD-10-CM | POA: Diagnosis not present

## 2018-04-06 DIAGNOSIS — R252 Cramp and spasm: Secondary | ICD-10-CM | POA: Diagnosis not present

## 2018-04-06 DIAGNOSIS — R413 Other amnesia: Secondary | ICD-10-CM | POA: Diagnosis not present

## 2018-04-06 DIAGNOSIS — H8193 Unspecified disorder of vestibular function, bilateral: Secondary | ICD-10-CM | POA: Diagnosis not present

## 2018-04-06 DIAGNOSIS — Z0000 Encounter for general adult medical examination without abnormal findings: Secondary | ICD-10-CM | POA: Diagnosis not present

## 2018-04-06 DIAGNOSIS — E038 Other specified hypothyroidism: Secondary | ICD-10-CM | POA: Diagnosis not present

## 2018-04-06 DIAGNOSIS — E7849 Other hyperlipidemia: Secondary | ICD-10-CM | POA: Diagnosis not present

## 2018-04-06 DIAGNOSIS — R42 Dizziness and giddiness: Secondary | ICD-10-CM | POA: Diagnosis not present

## 2018-04-06 DIAGNOSIS — R0602 Shortness of breath: Secondary | ICD-10-CM | POA: Diagnosis not present

## 2018-04-08 ENCOUNTER — Other Ambulatory Visit: Payer: Self-pay | Admitting: Internal Medicine

## 2018-04-08 DIAGNOSIS — R413 Other amnesia: Secondary | ICD-10-CM

## 2018-04-08 DIAGNOSIS — R2689 Other abnormalities of gait and mobility: Secondary | ICD-10-CM

## 2018-04-22 DIAGNOSIS — N952 Postmenopausal atrophic vaginitis: Secondary | ICD-10-CM | POA: Diagnosis not present

## 2018-04-22 DIAGNOSIS — Z01419 Encounter for gynecological examination (general) (routine) without abnormal findings: Secondary | ICD-10-CM | POA: Diagnosis not present

## 2018-04-27 ENCOUNTER — Ambulatory Visit
Admission: RE | Admit: 2018-04-27 | Discharge: 2018-04-27 | Disposition: A | Discharge status: Home / Self Care | Payer: Medicare Other | Source: Ambulatory Visit | Attending: Internal Medicine | Admitting: Internal Medicine

## 2018-04-27 DIAGNOSIS — Z1151 Encounter for screening for human papillomavirus (HPV): Secondary | ICD-10-CM | POA: Diagnosis not present

## 2018-04-27 DIAGNOSIS — R413 Other amnesia: Secondary | ICD-10-CM

## 2018-04-27 DIAGNOSIS — R2689 Other abnormalities of gait and mobility: Secondary | ICD-10-CM

## 2018-04-27 MED ORDER — GADOBENATE DIMEGLUMINE 529 MG/ML IV SOLN
12.0000 mL | Freq: Once | INTRAVENOUS | Status: AC | PRN
  Administered 2018-04-27: 12 mL via INTRAVENOUS

## 2018-05-15 DIAGNOSIS — H40013 Open angle with borderline findings, low risk, bilateral: Secondary | ICD-10-CM | POA: Diagnosis not present

## 2018-05-19 ENCOUNTER — Ambulatory Visit (INDEPENDENT_AMBULATORY_CARE_PROVIDER_SITE_OTHER): Payer: Medicare Other | Admitting: Cardiovascular Disease

## 2018-05-19 ENCOUNTER — Encounter: Payer: Self-pay | Admitting: Cardiovascular Disease

## 2018-05-19 VITALS — BP 94/58 | HR 66 | Ht 63.0 in | Wt 139.0 lb

## 2018-05-19 DIAGNOSIS — Z01812 Encounter for preprocedural laboratory examination: Secondary | ICD-10-CM | POA: Diagnosis not present

## 2018-05-19 DIAGNOSIS — R072 Precordial pain: Secondary | ICD-10-CM | POA: Diagnosis not present

## 2018-05-19 DIAGNOSIS — E039 Hypothyroidism, unspecified: Secondary | ICD-10-CM

## 2018-05-19 DIAGNOSIS — R0602 Shortness of breath: Secondary | ICD-10-CM | POA: Diagnosis not present

## 2018-05-19 DIAGNOSIS — R0789 Other chest pain: Secondary | ICD-10-CM

## 2018-05-19 DIAGNOSIS — E7800 Pure hypercholesterolemia, unspecified: Secondary | ICD-10-CM | POA: Diagnosis not present

## 2018-05-19 DIAGNOSIS — K219 Gastro-esophageal reflux disease without esophagitis: Secondary | ICD-10-CM

## 2018-05-19 MED ORDER — METOPROLOL TARTRATE 25 MG PO TABS: ORAL_TABLET | ORAL | 0 refills | Status: DC

## 2018-05-19 NOTE — Progress Notes (Signed)
Cardiology Office Note   Date:  05/27/2018   ID:  Leslie Willis, DOB June 10, 1941, MRN [id number]  PCP:  Burnard Bunting, MD  Cardiologist:   Skeet Latch, MD   No chief complaint on file.    History of Present Illness: Leslie Willis is a 77 y.o. female with hyperlipidemia, hypothyroidism and GERD who is being seen today for the evaluation of exertional arm discomfort at the request of Burnard Bunting, MD.  Ms. Bolick has noted that she sometimes gets cramps in her arms when walking up and down hills.  She does not have it when walking on flat land.  Due to that she started walking more slowly.  She first noted it around 05/2016 when she first moved to [place].  It is associated with shortness of breath and sometimes with some chest tightness.  He feels like a wave of discomfort and cramping that lasts for 1 to 2 minutes at a time.  The discomfort ranges from 4-8 out of 10 in severity.  She does exercise classes at the [facility] center regularly and never gets chest discomfort or arm discomfort with these classes.  She has not noted any lower extremity edema, orthopnea, or PND.  She has a chronic feeling of being unable to catch her breath.  This occurs both at rest and with exertion.  The episodes are not associated with nausea or diaphoresis.  She has noted some mild orthostatic lightheadedness but no syncope or presyncope.   Past Medical History:  Diagnosis Date  . Atypical chest pain 05/27/2018  . GERD (gastroesophageal reflux disease) 05/27/2018  . Hypothyroidism 05/27/2018  . Pure hypercholesterolemia 05/27/2018    History reviewed. No pertinent surgical history.   Current Outpatient Medications  Medication Sig Dispense Refill  . Diclofenac Sodium CR 100 MG 24 hr tablet Take 1 tablet by mouth daily.    . LUTEIN PO Take 1 tablet by mouth daily.    . Omega-3 Fatty Acids (FISH OIL) 1000 MG CAPS Take 1 capsule by mouth daily.    . pantoprazole (PROTONIX) 40 MG tablet TAKE 1  TABLET BY MOUTH EVERY DAY FOR GERD  3  . simvastatin (ZOCOR) 10 MG tablet Take 10 mg by mouth at bedtime.  3  . SYNTHROID 112 MCG tablet Take 1 tablet by mouth daily.    . cholecalciferol (VITAMIN D) 1000 units tablet Take 1,000 Units by mouth daily.    Marland Kitchen co-enzyme Q-10 30 MG capsule Take 100 mg by mouth daily.    . fexofenadine (ALLEGRA) 180 MG tablet Take 180 mg by mouth daily.    . metoprolol tartrate (LOPRESSOR) 25 MG tablet TAKE 1 TABLET BY MOUTH 1 HOUR PRIOR TO CT SCAN 1 tablet 0   No current facility-administered medications for this visit.     Allergies:   Sulfa antibiotics    Social History:  The patient  reports that she has never smoked. She has never used smokeless tobacco. She reports that she drinks alcohol. She reports that she does not use drugs.   Family History:  [name]; [name]; [name]; [name]; [name], mother, and paternal grandmother.    ROS:  Please see the history of present illness.   Otherwise, review of systems are positive for arthritis.   All other systems are reviewed and negative.    PHYSICAL EXAM: VS:  BP Marland Kitchen)  94/58   Pulse 66   Ht 5\' 3"  (1.6 m)   Wt 139 lb (63 kg)   SpO2 98%   BMI 24.62 kg/m  , BMI Body mass index is 24.62 kg/m. GENERAL:  Well appearing HEENT:  Pupils equal round and reactive, fundi not visualized, oral mucosa unremarkable NECK:  No jugular venous distention, waveform within normal limits, carotid upstroke brisk and symmetric, no bruits, no thyromegaly LYMPHATICS:  No cervical adenopathy LUNGS:  Clear to auscultation bilaterally HEART:  RRR.  PMI not displaced or sustained,S1 and S2 within normal limits, no S3, no S4, no clicks, no rubs, no murmurs ABD:  Flat, positive bowel sounds normal in frequency in pitch, no bruits, no rebound, no guarding, no midline pulsatile mass, no hepatomegaly, no  splenomegaly EXT:  2 plus pulses throughout, no edema, no cyanosis no clubbing SKIN:  No rashes no nodules NEURO:  Cranial nerves II through XII grossly intact, motor grossly intact throughout PSYCH:  Cognitively intact, oriented to person place and time   EKG:  EKG is ordered today. The ekg ordered today demonstrates sinus rhythm.  Rate 66 bpm.  Low voltage.  Non-specific T wave abnormalities.    Recent Labs: No results found for requested labs within last 8760 hours.    Lipid Panel No results found for: CHOL, TRIG, HDL, CHOLHDL, VLDL, LDLCALC, LDLDIRECT    Wt Readings from Last 3 Encounters:  05/19/18 139 lb (63 kg)     ASSESSMENT AND PLAN:  # Atypical chest pain: Ms. Aramburo's chest pain has both typical and atypical components.  However the fact that it happens with walking up hills is concerning.  She does not get it with other forms of exercise.  Her risk factors include age and hyperlipidemia.  She also has a strong family history of CAD.  We will get a coronary C T-a to assess her coronary arteries.  This will also help [place] with knowing what her cholesterol goal should be.  For now we will have her take aspirin 81 mg daily.  We will also get an echo.   Current medicines are reviewed at length with the patient today.  The patient does not have concerns regarding medicines.  The following changes have been made:  no change  Labs/ tests ordered today include:   Orders Placed This Encounter  Procedures  . CT CORONARY MORPH W/CTA COR W/SCORE W/CA W/CM &/OR WO/CM  . CT CORONARY FRACTIONAL FLOW RESERVE DATA PREP  . CT CORONARY FRACTIONAL FLOW RESERVE FLUID ANALYSIS  . Basic metabolic panel  . EKG 12-Lead  . ECHOCARDIOGRAM COMPLETE     Disposition:   FU with Ajani Schnieders C. Oval Linsey, MD, [facility] in 1-2 months.     Signed, Soha Thorup C. Oval Linsey, MD, [facility]  05/27/2018 5:31 PM    [place]

## 2018-05-19 NOTE — Patient Instructions (Addendum)
Medication Instructions:  TAKE METOPROLOL 25 MG 1 HOUR PRIOR TO TEST TIME   Labwork: BMET 1 WEEK PRIOR TO CT SCAN   Testing/Procedures: Your physician has requested that you have an echocardiogram. Echocardiography is a painless test that uses sound waves to create images of your heart. It provides your doctor with information about the size and shape of your heart and how well your heart's chambers and valves are working. This procedure takes approximately one hour. There are no restrictions for this procedure. [place] [address]  Your physician has requested that you have cardiac CT. Cardiac computed tomography (CT) is a painless test that uses an x-ray machine to take clear, detailed pictures of your heart. For further information please visit [email or web address]. Please follow instruction sheet as given. THE OFFICE WILL CALL YOU TO SCHEDULE ONCE THIS HAS BEEN APPROVED BY YOUR INSURANCE   Follow-Up: Your physician recommends that you schedule a follow-up appointment in: 1-2 MONTHS  Any Other Special Instructions Will Be Listed Below (If Applicable).  Please arrive at the [facility] main entrance of [facility] at xx:xx AM (30-45 minutes prior to test start time)  [facility] [place], [place] [address] [phone]  Proceed to the [facility] Radiology Department (First Floor).  Please follow these instructions carefully (unless otherwise directed):  On the Night Before the Test: . Drink plenty of water. . Do not consume any caffeinated/decaffeinated beverages or chocolate 12 hours prior to your test. . Do not take any antihistamines 12 hours prior to your test. . If the patient has contrast allergy: ? Patient will need a prescription for Prednisone and very clear instructions (as follows): 1. Prednisone 50 mg - take 13 hours prior to test 2. Take another Prednisone 50 mg 7 hours prior to test 3. Take another  Prednisone 50 mg 1 hour prior to test 4. Take Benadryl 50 mg 1 hour prior to test . Patient must complete all four doses of above prophylactic medications. . Patient will need a ride after test due to Benadryl.  On the Day of the Test: . Drink plenty of water. Do not drink any water within one hour of the test. . Do not eat any food 4 hours prior to the test. . You may take your regular medications prior to the test. . IF NOT ON A BETA BLOCKER - Take 25 mg of lopressor (metoprolol) one hour before the test.  After the Test: . Drink plenty of water. . After receiving IV contrast, you may experience a mild flushed feeling. This is normal. . On occasion, you may experience a mild rash up to 24 hours after the test. This is not dangerous. If this occurs, you can take Benadryl 25 mg and increase your fluid intake. . If you experience trouble breathing, this can be serious. If it is severe call 911 IMMEDIATELY. If it is mild, please call our office.   Cardiac CT Angiogram A cardiac CT angiogram is a procedure to look at the heart and the area around the heart. It may be done to help find the cause of chest pains or other symptoms of heart disease. During this procedure, a large X-ray machine, called a CT scanner, takes detailed pictures of the heart and the surrounding area after a dye (contrast material) has been injected into blood vessels in the area. The procedure is also sometimes called a coronary CT angiogram, coronary artery scanning, or CTA.  A cardiac CT angiogram allows the health care provider to see how well blood is flowing to and from the heart. The health care provider will be able to see if there are any problems, such as:  Blockage or narrowing of the coronary arteries in the heart.  Fluid around the heart.  Signs of weakness or disease in the muscles, valves, and tissues of the heart.  Tell a health care provider about:  Any allergies you have. This is especially important  if you have had a previous allergic reaction to contrast dye.  All medicines you are taking, including vitamins, herbs, eye drops, creams, and over-the-counter medicines.  Any blood disorders you have.  Any surgeries you have had.  Any medical conditions you have.  Whether you are pregnant or may be pregnant.  Any anxiety disorders, chronic pain, or other conditions you have that may increase your stress or prevent you from lying still. What are the risks? Generally, this is a safe procedure. However, problems may occur, including:  Bleeding.  Infection.  Allergic reactions to medicines or dyes.  Damage to other structures or organs.  Kidney damage from the dye or contrast that is used.  Increased risk of cancer from radiation exposure. This risk is low. Talk with your health care provider about: ? The risks and benefits of testing. ? How you can receive the lowest dose of radiation.  What happens before the procedure?  Wear comfortable clothing and remove any jewelry, glasses, dentures, and hearing aids.  Follow instructions from your health care provider about eating and drinking. This may include: ? For 12 hours before the test - avoid caffeine. This includes tea, coffee, soda, energy drinks, and diet pills. Drink plenty of water or other fluids that do not have caffeine in them. Being well-hydrated can prevent complications. ? For 4-6 hours before the test - stop eating and drinking. The contrast dye can cause nausea, but this is less likely if your stomach is empty.  Ask your health care provider about changing or stopping your regular medicines. This is especially important if you are taking diabetes medicines, blood thinners, or medicines to treat erectile dysfunction. What happens during the procedure?  Hair on your chest may need to be removed so that small sticky patches called electrodes can be placed on your chest. These will transmit information that helps to  monitor your heart during the test.  An IV tube will be inserted into one of your veins.  You might be given a medicine to control your heart rate during the test. This will help to ensure that good images are obtained.  You will be asked to lie on an exam table. This table will slide in and out of the CT machine during the procedure.  Contrast dye will be injected into the IV tube. You might feel warm, or you may get a metallic taste in your mouth.  You will be given a medicine (nitroglycerin) to relax (dilate) the arteries in your heart.  The table that you are lying on will move into the CT machine tunnel for the scan.  The person running the machine will give you instructions while the scans are being done. You may be asked to: ? Keep your arms above your head. ? Hold your breath. ? Stay very still, even if the table is moving.  When the scanning is complete, you will be moved out of the machine.  The IV tube will be removed. The procedure may  vary among health care providers and hospitals. What happens after the procedure?  You might feel warm, or you may get a metallic taste in your mouth from the contrast dye.  You may have a headache from the nitroglycerin.  After the procedure, drink water or other fluids to wash (flush) the contrast material out of your body.  Contact a health care provider if you have any symptoms of allergy to the contrast. These symptoms include: ? Shortness of breath. ? Rash or hives. ? A racing heartbeat.  Most people can return to their normal activities right after the procedure. Ask your health care provider what activities are safe for you.  It is up to you to get the results of your procedure. Ask your health care provider, or the department that is doing the procedure, when your results will be ready. Summary  A cardiac CT angiogram is a procedure to look at the heart and the area around the heart. It may be done to help find the cause of  chest pains or other symptoms of heart disease.  During this procedure, a large X-ray machine, called a CT scanner, takes detailed pictures of the heart and the surrounding area after a dye (contrast material) has been injected into blood vessels in the area.  Ask your health care provider about changing or stopping your regular medicines before the procedure. This is especially important if you are taking diabetes medicines, blood thinners, or medicines to treat erectile dysfunction.  After the procedure, drink water or other fluids to wash (flush) the contrast material out of your body. This information is not intended to replace advice given to you by your health care provider. Make sure you discuss any questions you have with your health care provider. Document Released: 09/12/2008 Document Revised: 08/19/2016 Document Reviewed: 08/19/2016 Elsevier Interactive Patient Education  2017 [employer].  Echocardiogram An echocardiogram, or echocardiography, uses sound waves (ultrasound) to produce an image of your heart. The echocardiogram is simple, painless, obtained within a short period of time, and offers valuable information to your health care provider. The images from an echocardiogram can provide information such as:  Evidence of coronary artery disease (CAD).  Heart size.  Heart muscle function.  Heart valve function.  Aneurysm detection.  Evidence of a past heart attack.  Fluid buildup around the heart.  Heart muscle thickening.  Assess heart valve function.  Tell a health care provider about:  Any allergies you have.  All medicines you are taking, including vitamins, herbs, eye drops, creams, and over-the-counter medicines.  Any problems you or family members have had with anesthetic medicines.  Any blood disorders you have.  Any surgeries you have had.  Any medical conditions you have.  Whether you are pregnant or may be pregnant. What happens before the  procedure? No special preparation is needed. Eat and drink normally. What happens during the procedure?  In order to produce an image of your heart, gel will be applied to your chest and a wand-like tool (transducer) will be moved over your chest. The gel will help transmit the sound waves from the transducer. The sound waves will harmlessly bounce off your heart to allow the heart images to be captured in real-time motion. These images will then be recorded.  You may need an IV to receive a medicine that improves the quality of the pictures. What happens after the procedure? You may return to your normal schedule including diet, activities, and medicines, unless your health care  provider tells you otherwise. This information is not intended to replace advice given to you by your health care provider. Make sure you discuss any questions you have with your health care provider. Document Released: 09/27/2000 Document Revised: 05/18/2016 Document Reviewed: 06/07/2013 Elsevier Interactive Patient Education  2017 [employer].

## 2018-05-20 ENCOUNTER — Other Ambulatory Visit: Payer: Self-pay

## 2018-05-20 ENCOUNTER — Telehealth: Payer: Self-pay | Admitting: Cardiovascular Disease

## 2018-05-20 NOTE — Telephone Encounter (Signed)
New Message:    She was here yesterday to see Dr Oval Linsey. She noticed on her instruction sheet,her medicine list was incorrect. Please call so she can get the list corrected,

## 2018-05-20 NOTE — Telephone Encounter (Signed)
Left message for pt to call back  °

## 2018-05-20 NOTE — Telephone Encounter (Signed)
Pt called to report that she is not taking calcium with D but just plain Vit D, she is also taking Allerfex 180mg  a day ([employer] brand Allegra), and CoQ10 10mg  a day. I advised her that we really appreciate her letting us know of theses changes.

## 2018-05-27 ENCOUNTER — Encounter: Payer: Self-pay | Admitting: Cardiovascular Disease

## 2018-05-27 DIAGNOSIS — R0789 Other chest pain: Secondary | ICD-10-CM

## 2018-05-27 DIAGNOSIS — E7800 Pure hypercholesterolemia, unspecified: Secondary | ICD-10-CM

## 2018-05-27 DIAGNOSIS — K219 Gastro-esophageal reflux disease without esophagitis: Secondary | ICD-10-CM

## 2018-05-27 DIAGNOSIS — E039 Hypothyroidism, unspecified: Secondary | ICD-10-CM

## 2018-05-27 HISTORY — DX: Hypothyroidism, unspecified: E03.9

## 2018-05-27 HISTORY — DX: Other chest pain: R07.89

## 2018-05-27 HISTORY — DX: Gastro-esophageal reflux disease without esophagitis: K21.9

## 2018-05-27 HISTORY — DX: Pure hypercholesterolemia, unspecified: E78.00

## 2018-05-28 ENCOUNTER — Other Ambulatory Visit: Payer: Self-pay

## 2018-05-28 ENCOUNTER — Ambulatory Visit (HOSPITAL_COMMUNITY): Payer: Medicare Other | Attending: Cardiology

## 2018-05-28 DIAGNOSIS — E785 Hyperlipidemia, unspecified: Secondary | ICD-10-CM | POA: Insufficient documentation

## 2018-05-28 DIAGNOSIS — R0602 Shortness of breath: Secondary | ICD-10-CM

## 2018-05-28 DIAGNOSIS — I071 Rheumatic tricuspid insufficiency: Secondary | ICD-10-CM | POA: Diagnosis not present

## 2018-05-28 DIAGNOSIS — R072 Precordial pain: Secondary | ICD-10-CM | POA: Insufficient documentation

## 2018-06-11 ENCOUNTER — Ambulatory Visit: Payer: Medicare Other | Admitting: Cardiovascular Disease

## 2018-06-19 DIAGNOSIS — Z23 Encounter for immunization: Secondary | ICD-10-CM | POA: Diagnosis not present

## 2018-06-24 ENCOUNTER — Other Ambulatory Visit: Payer: Self-pay | Admitting: Cardiovascular Disease

## 2018-06-24 DIAGNOSIS — Z01812 Encounter for preprocedural laboratory examination: Secondary | ICD-10-CM | POA: Diagnosis not present

## 2018-06-24 DIAGNOSIS — R072 Precordial pain: Secondary | ICD-10-CM | POA: Diagnosis not present

## 2018-06-24 DIAGNOSIS — R0602 Shortness of breath: Secondary | ICD-10-CM | POA: Diagnosis not present

## 2018-06-24 LAB — BASIC METABOLIC PANEL WITH GFR
BUN/Creatinine Ratio: 16 (calc) (ref 6–22)
BUN: 16 mg/dL (ref 7–25)
CO2: 23 mmol/L (ref 20–32)
Calcium: 9.4 mg/dL (ref 8.6–10.4)
Chloride: 105 mmol/L (ref 98–110)
Creat: 0.97 mg/dL — ABNORMAL HIGH (ref 0.60–0.93)
GFR, Est African American: 66 mL/min/{1.73_m2} (ref >=60)
GFR, Est Non African American: 57 mL/min/{1.73_m2} — ABNORMAL LOW (ref >=60)
Glucose, Bld: 84 mg/dL (ref 65–99)
Potassium: 4.7 mmol/L (ref 3.5–5.3)
Sodium: 139 mmol/L (ref 135–146)

## 2018-06-25 ENCOUNTER — Ambulatory Visit: Payer: Medicare Other | Admitting: Cardiovascular Disease

## 2018-06-26 ENCOUNTER — Ambulatory Visit (INDEPENDENT_AMBULATORY_CARE_PROVIDER_SITE_OTHER): Payer: Medicare Other | Admitting: Cardiovascular Disease

## 2018-06-26 ENCOUNTER — Encounter: Payer: Self-pay | Admitting: Cardiovascular Disease

## 2018-06-26 VITALS — BP 102/62 | HR 62 | Ht 63.0 in | Wt 138.4 lb

## 2018-06-26 DIAGNOSIS — R0789 Other chest pain: Secondary | ICD-10-CM

## 2018-06-26 DIAGNOSIS — E7800 Pure hypercholesterolemia, unspecified: Secondary | ICD-10-CM | POA: Diagnosis not present

## 2018-06-26 NOTE — Patient Instructions (Signed)
Medication Instructions:  Your physician recommends that you continue on your current medications as directed. Please refer to the Current Medication list given to you today.  Labwork: noen  Testing/Procedures: KEEP CT AS SCHEDULED   Follow-Up: Your physician wants you to follow-up in: 6 MONTHS  You will receive a reminder letter in the mail two months in advance. If you don't receive a letter, please call our office to schedule the follow-up appointment.  If you need a refill on your cardiac medications before your next appointment, please call your pharmacy.

## 2018-06-26 NOTE — Progress Notes (Signed)
Cardiology Office Note   Date:  06/26/2018   ID:  Leslie Willis, DOB December 23, 1940, MRN [id number]  PCP:  Burnard Bunting, MD  Cardiologist:   Skeet Latch, MD   No chief complaint on file.    History of Present Illness: Leslie Willis is a 77 y.o. female with hyperlipidemia, hypothyroidism and GERD here for follow-up.  She was initially seen 05/2017 for the evaluation of exertional arm discomfort.  Leslie Willis has noted that she sometimes gets cramps in her arms when walking up and down hills.  She does not have it when walking on flat land.  Due to that she started walking more slowly.  She first noted it around 05/2016 when she first moved to [place].  It is associated with shortness of breath and sometimes with some chest tightness.  He feels like a wave of discomfort and cramping that lasts for 1 to 2 minutes at a time.  The discomfort ranges from 4-8 out of 10 in severity.  She does exercise classes at the [facility] center regularly and never gets chest discomfort or arm discomfort with these classes.  She has not noted any lower extremity edema, orthopnea, or PND.  At her last appointment she was referred for an echocardiogram 05/2018 that revealed LVEF 60 to 65% with grade 2 diastolic dysfunction.  There is no evidence of volume overload.  She was also referred for cardiac CT-A.  However this is scheduled for next week.  She is wondering if she still needs it.  Since her appointment she has had another episode of exertional discomfort that occurred when walking from the [facility] up an incline.   Past Medical History:  Diagnosis Date  . Atypical chest pain 05/27/2018  . GERD (gastroesophageal reflux disease) 05/27/2018  . Hypothyroidism 05/27/2018  . Pure hypercholesterolemia 05/27/2018    History reviewed. No pertinent surgical history.   Current Outpatient Medications  Medication Sig Dispense Refill  . cholecalciferol (VITAMIN D) 1000 units tablet Take 1,000 Units  by mouth daily.    Marland Kitchen co-enzyme Q-10 30 MG capsule Take 100 mg by mouth daily.    . Diclofenac Sodium CR 100 MG 24 hr tablet Take 1 tablet by mouth daily.    . fexofenadine (ALLEGRA) 180 MG tablet Take 180 mg by mouth daily.    . LUTEIN PO Take 1 tablet by mouth daily.    . metoprolol tartrate (LOPRESSOR) 25 MG tablet TAKE 1 TABLET BY MOUTH 1 HOUR PRIOR TO CT SCAN 1 tablet 0  . Omega-3 Fatty Acids (FISH OIL) 1000 MG CAPS Take 1 capsule by mouth daily.    . pantoprazole (PROTONIX) 40 MG tablet TAKE 1 TABLET BY MOUTH EVERY DAY FOR GERD  3  . simvastatin (ZOCOR) 10 MG tablet Take 10 mg by mouth at bedtime.  3  . SYNTHROID 112 MCG tablet Take 1 tablet by mouth daily.     No current facility-administered medications for this visit.     Allergies:   Sulfa antibiotics    Social History:  The patient  reports that she has never smoked. She has never used smokeless tobacco. She reports that she drinks alcohol. She reports that she does not use drugs.   Family History:  [name]; [name]; [name]; [name]; [name], mother, and paternal grandmother.    ROS:  Please see the  history of present illness.   Otherwise, review of systems are positive for arthritis.   All other systems are reviewed and negative.    PHYSICAL EXAM: VS:  BP 102/62   Pulse 62   Ht 5\' 3"  (1.6 m)   Wt 138 lb 6.4 oz (62.8 kg)   BMI 24.52 kg/m  , BMI Body mass index is 24.52 kg/m. GENERAL:  Well appearing HEENT:  Pupils equal round and reactive, fundi not visualized, oral mucosa unremarkable NECK:  No jugular venous distention, waveform within normal limits, carotid upstroke brisk and symmetric, no bruits, no thyromegaly LYMPHATICS:  No cervical adenopathy LUNGS:  Clear to auscultation bilaterally HEART:  RRR.  PMI not displaced or sustained,S1 and S2 within normal limits, no S3, no S4, no  clicks, no rubs, no murmurs ABD:  Flat, positive bowel sounds normal in frequency in pitch, no bruits, no rebound, no guarding, no midline pulsatile mass, no hepatomegaly, no splenomegaly EXT:  2 plus pulses throughout, no edema, no cyanosis no clubbing SKIN:  No rashes no nodules NEURO:  Cranial nerves II through XII grossly intact, motor grossly intact throughout PSYCH:  Cognitively intact, oriented to person place and time   EKG:  EKG is ordered today. The ekg ordered today demonstrates sinus rhythm.  Rate 66 bpm.  Low voltage.  Non-specific T wave abnormalities.   Echo 05/28/18:  Study Conclusions  - Left ventricle: The cavity size was normal. Wall thickness was   normal. Systolic function was normal. The estimated ejection   fraction was in the range of 60% to 65%. Wall motion was normal;   there were no regional wall motion abnormalities. Features are   consistent with a pseudonormal left ventricular filling pattern,   with concomitant abnormal relaxation and increased filling   pressure (grade 2 diastolic dysfunction). - Aortic valve: Trileaflet; mildly thickened, mildly calcified   leaflets. - Mitral valve: There was trivial regurgitation. - Tricuspid valve: There was mild regurgitation. - Pulmonary arteries: Systolic pressure was mildly increased. PA   peak pressure: 37 mm Hg (S). Recent Labs: No results found for requested labs within last 8760 hours.    Lipid Panel No results found for: CHOL, TRIG, HDL, CHOLHDL, VLDL, LDLCALC, LDLDIRECT    Wt Readings from Last 3 Encounters:  06/26/18 138 lb 6.4 oz (62.8 kg)  05/19/18 139 lb (63 kg)     ASSESSMENT AND PLAN:  # Atypical chest pain: Symptoms are concerning for ischemia.  It could also be due to deconditioning.  Echo showed diastolic dysfunction.  She has no heart failure on exam to rule out ischemia.  Based on this we will also be better able to tailor her LDL goal.  # Hyperlipidemia:  Continue simvastatin.  LDL  119 on 03/2018.   Current medicines are reviewed at length with the patient today.  The patient does not have concerns regarding medicines.  The following changes have been made:  no change  Labs/ tests ordered today include:   No orders of the defined types were placed in this encounter.    Disposition:   FU with Leslie Sunde C. Oval Linsey, MD, [facility] in 6  months.     Signed, Leslie Deringer C. Oval Linsey, MD, [facility]  06/26/2018 4:48 PM    [place]

## 2018-06-30 ENCOUNTER — Other Ambulatory Visit: Payer: Self-pay | Admitting: Cardiovascular Disease

## 2018-06-30 ENCOUNTER — Encounter (HOSPITAL_COMMUNITY): Payer: Self-pay | Admitting: Interventional Radiology

## 2018-06-30 ENCOUNTER — Ambulatory Visit (HOSPITAL_COMMUNITY)
Admission: RE | Admit: 2018-06-30 | Discharge: 2018-06-30 | Disposition: A | Discharge status: Home / Self Care | Payer: Medicare Other | Source: Ambulatory Visit | Attending: Cardiovascular Disease | Admitting: Cardiovascular Disease

## 2018-06-30 DIAGNOSIS — R072 Precordial pain: Secondary | ICD-10-CM | POA: Diagnosis not present

## 2018-06-30 DIAGNOSIS — R0602 Shortness of breath: Secondary | ICD-10-CM

## 2018-06-30 DIAGNOSIS — Z452 Encounter for adjustment and management of vascular access device: Secondary | ICD-10-CM | POA: Diagnosis not present

## 2018-06-30 DIAGNOSIS — R079 Chest pain, unspecified: Secondary | ICD-10-CM | POA: Diagnosis not present

## 2018-06-30 DIAGNOSIS — I878 Other specified disorders of veins: Secondary | ICD-10-CM | POA: Insufficient documentation

## 2018-06-30 HISTORY — PX: IR RADIOLOGY PERIPHERAL GUIDED IV START: IMG5598

## 2018-06-30 HISTORY — PX: IR US GUIDE VASC ACCESS LEFT: IMG2389

## 2018-06-30 MED ORDER — LIDOCAINE HCL (PF) 1 % IJ SOLN
INTRAMUSCULAR | Status: DC | PRN
  Administered 2018-06-30: 5 mL

## 2018-06-30 MED ORDER — NITROGLYCERIN 0.4 MG SL SUBL
SUBLINGUAL_TABLET | SUBLINGUAL | Status: AC
  Administered 2018-06-30: 0.8 mg
  Filled 2018-06-30: qty 1

## 2018-06-30 MED ORDER — LIDOCAINE HCL 1 % IJ SOLN
INTRAMUSCULAR | Status: AC
  Filled 2018-06-30: qty 20

## 2018-06-30 MED ORDER — IOPAMIDOL (ISOVUE-370) INJECTION 76%
100.0000 mL | Freq: Once | INTRAVENOUS | Status: AC | PRN
  Administered 2018-06-30: 80 mL via INTRAVENOUS

## 2018-06-30 NOTE — Progress Notes (Signed)
Pt has been stuck multiple times for an IV; at her request for staff to keep trying. IV team also obtained IV, but it did not work for scan; IR radiology MD will insert IV for CT heart scan test ' pt in agreement, thankful to be able to get procedure done

## 2018-08-18 DIAGNOSIS — M2010 Hallux valgus (acquired), unspecified foot: Secondary | ICD-10-CM | POA: Diagnosis not present

## 2018-08-18 DIAGNOSIS — Z6825 Body mass index (BMI) 25.0-25.9, adult: Secondary | ICD-10-CM | POA: Diagnosis not present

## 2018-09-04 ENCOUNTER — Other Ambulatory Visit: Payer: Self-pay | Admitting: Podiatry

## 2018-09-04 ENCOUNTER — Ambulatory Visit (INDEPENDENT_AMBULATORY_CARE_PROVIDER_SITE_OTHER): Payer: Medicare Other | Admitting: Podiatry

## 2018-09-04 ENCOUNTER — Ambulatory Visit (INDEPENDENT_AMBULATORY_CARE_PROVIDER_SITE_OTHER): Payer: Medicare Other

## 2018-09-04 VITALS — BP 100/63 | HR 67

## 2018-09-04 DIAGNOSIS — M21619 Bunion of unspecified foot: Secondary | ICD-10-CM | POA: Diagnosis not present

## 2018-09-04 DIAGNOSIS — M205X2 Other deformities of toe(s) (acquired), left foot: Secondary | ICD-10-CM

## 2018-09-04 DIAGNOSIS — M205X1 Other deformities of toe(s) (acquired), right foot: Secondary | ICD-10-CM | POA: Diagnosis not present

## 2018-09-04 DIAGNOSIS — M205X9 Other deformities of toe(s) (acquired), unspecified foot: Secondary | ICD-10-CM | POA: Diagnosis not present

## 2018-09-04 DIAGNOSIS — M79671 Pain in right foot: Secondary | ICD-10-CM

## 2018-09-04 DIAGNOSIS — M79672 Pain in left foot: Secondary | ICD-10-CM

## 2018-09-06 NOTE — Progress Notes (Signed)
Subjective:   Patient ID: Leslie Willis, female   DOB: 77 y.o.   MRN: [id number]   HPI 77 year old female presents the office today concerns of her second toe crossing over the big toe which is been ongoing for the last 2 weeks has been gradually getting worse and she has difficulty with discomfort with walking.  She also gets a callus along the forefoot on the ball of her foot pointing to submetatarsal 2 area more on her left foot.  She states that she has to wear a gel sleeve.  She has a sharp pain into the 2nd toe.  She denies any recent injury or trauma.  No other concerns.  No significant swelling.   Review of Systems  All other systems reviewed and are negative.  Past Medical History:  Diagnosis Date  . Atypical chest pain 05/27/2018  . GERD (gastroesophageal reflux disease) 05/27/2018  . Hypothyroidism 05/27/2018  . Pure hypercholesterolemia 05/27/2018    Past Surgical History:  Procedure Laterality Date  . IR RADIOLOGY PERIPHERAL GUIDED IV START  06/30/2018  . IR US GUIDE VASC ACCESS LEFT  06/30/2018     Current Outpatient Medications:  .  fluticasone (FLONASE) 50 MCG/ACT nasal spray, Use 2 squirts to each nostril 2 times daily for 7-10 days, then 2 squirts in each nare daily, Disp: , Rfl:  .  cholecalciferol (VITAMIN D) 1000 units tablet, Take 1,000 Units by mouth daily., Disp: , Rfl:  .  co-enzyme Q-10 30 MG capsule, Take 100 mg by mouth daily., Disp: , Rfl:  .  DICLOFENAC PO, Take by mouth., Disp: , Rfl:  .  Diclofenac Sodium CR 100 MG 24 hr tablet, Take 1 tablet by mouth daily., Disp: , Rfl:  .  fexofenadine (ALLEGRA) 180 MG tablet, Take 180 mg by mouth daily., Disp: , Rfl:  .  LEVOTHYROXINE SODIUM PO, Take by mouth., Disp: , Rfl:  .  LUTEIN PO, Take 1 tablet by mouth daily., Disp: , Rfl:  .  metoprolol tartrate (LOPRESSOR) 25 MG tablet, TAKE 1 TABLET BY MOUTH 1 HOUR PRIOR TO CT SCAN, Disp: 1 tablet, Rfl: 0 .  Omega-3 Fatty Acids (FISH OIL) 1000 MG CAPS, Take 1 capsule by  mouth daily., Disp: , Rfl:  .  pantoprazole (PROTONIX) 40 MG tablet, TAKE 1 TABLET BY MOUTH EVERY DAY FOR GERD, Disp: , Rfl: 3 .  simvastatin (ZOCOR) 10 MG tablet, Take 10 mg by mouth at bedtime., Disp: , Rfl: 3 .  SIMVASTATIN PO, Take by mouth., Disp: , Rfl:  .  SYNTHROID 112 MCG tablet, Take 1 tablet by mouth daily., Disp: , Rfl:   Allergies  Allergen Reactions  . Sulfa Antibiotics Rash    Topical use         Objective:  Physical Exam  General: AAO x3, NAD  Dermatological: Skin is warm, dry and supple bilateral. Nails x 10 are well manicured; remaining integument appears unremarkable at this time. There are no open sores, no preulcerative lesions, no rash or signs of infection present.  Vascular: Dorsalis Pedis artery and Posterior Tibial artery pedal pulses are 2/4 bilateral with immedate capillary fill time. Pedal hair growth present. No varicosities and no lower extremity edema present bilateral. There is no pain with calf compression, swelling, warmth, erythema.   Neruologic: Grossly intact via light touch bilateral.  Protective threshold with Semmes Wienstein monofilament intact to all pedal sites bilateral.   Musculoskeletal: Bunion deformities present bilaterally left side worse than the right and is try  to be overlapping second toe over the hallux.  There is no edema, erythema.  Able to reduce the second toe down to rectus position however hammertoe deformity is starting to form as well.  There is no area pinpoint tenderness identified otherwise.  No other areas of tenderness.  Muscular strength 5/5 in all groups tested bilateral.  Gait: Unassisted, Nonantalgic.     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  Bunion deformities present bilaterally left side worse  than the right there is overlapping second toe present.  No evidence of acute fracture. -We discussed both conservative as well as surgical treatment options.  Conservatively we discussed different offloading pads to help hold the toes and rectus position.  Dispensed a toe regulator as well as toe separators.  Ultimately discussed surgical intervention.  Her right side is more symptomatic on the left foot deformity is worse.  On the right foot discussed Austin, Akin bunionectomy with second metatarsal osteotomy with hammertoe repair.  If she would consider surgery she is going to this until the spring.  She will consider her options.  Trula Slade DPM

## 2019-04-06 DIAGNOSIS — H40013 Open angle with borderline findings, low risk, bilateral: Secondary | ICD-10-CM | POA: Diagnosis not present

## 2019-04-06 DIAGNOSIS — H5203 Hypermetropia, bilateral: Secondary | ICD-10-CM | POA: Diagnosis not present

## 2019-04-26 IMAGING — MR MR HEAD WO/W CM
12 series · 48 of 48 positions shown · IV contrast (12 ml multihance)
Comparison: None.

CLINICAL DATA: Memory impairment and balance problems

Creatinine was obtained on site at [HOSPITAL] at [HOSPITAL].
Results: Creatinine 0.9 mg/dL.
EXAM:
MRI HEAD WITHOUT AND WITH CONTRAST
TECHNIQUE: Multiplanar, multiecho pulse sequences of the brain and surrounding
structures were obtained without and with intravenous contrast.
CONTRAST:  12mL MULTIHANCE GADOBENATE DIMEGLUMINE 529 MG/ML IV SOLN

[Series 2: T1 · sagittal · 5.0mm · 0.45mm/px · 1 of 22 slices shown]
[im 1/22]
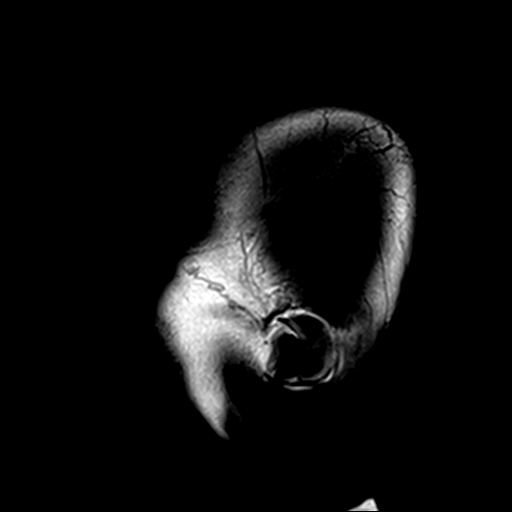

[Series 3: DWI · coronal · 5.0mm · 1.80mm/px · 5 of 68 slices shown (1 of 4)]
[im 1/68]
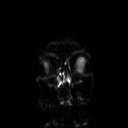
[im 17/68]
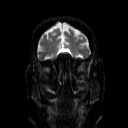
[im 34/68]
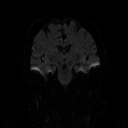
[im 51/68]
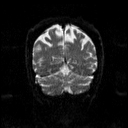
[im 68/68]
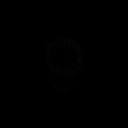

[Series 4: DWI · coronal · 5.0mm · 1.80mm/px · 2 of 34 slices shown (2 of 4)]
[im 1/34]
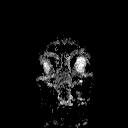
[im 34/34]
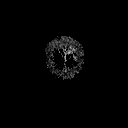

[Series 5: DWI · axial · 3.0mm · 1.80mm/px · z∈[-66,+81]mm · 7 of 100 slices shown (3 of 4)]
[im 1/100]
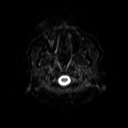
[im 17/100]
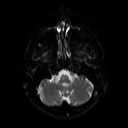
[im 34/100]
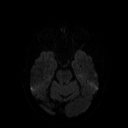
[im 50/100]
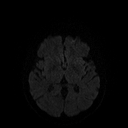
[im 67/100]
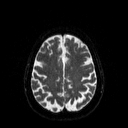
[im 83/100]
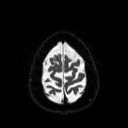
[im 100/100]
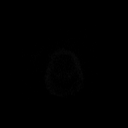

[Series 6: DWI · axial · 3.0mm · 1.80mm/px · z∈[-66,+81]mm · 3 of 50 slices shown (4 of 4)]
[im 1/50]
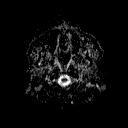
[im 25/50]
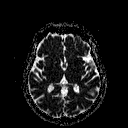
[im 50/50]
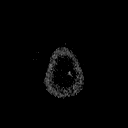

[Series 7: T2 · axial · 5.0mm · 0.51mm/px · z∈[-62,+80]mm · 2 of 22 slices shown (1 of 2)]
[im 1/22]
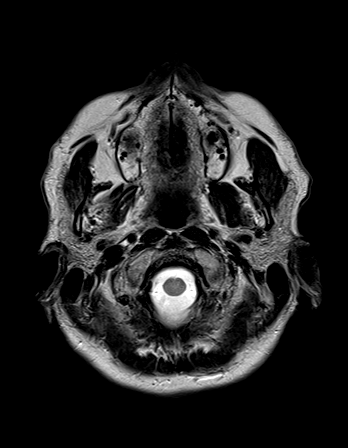
[im 22/22]
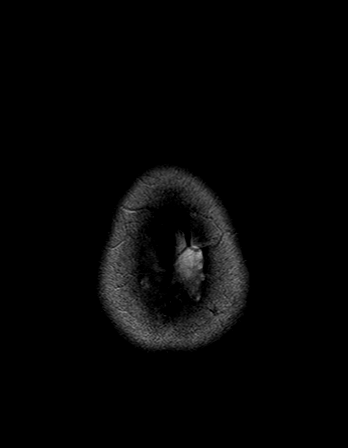

[Series 8: FLAIR · axial · 3.0mm · 0.45mm/px · z∈[-64,+80]mm · 2 of 32 slices shown]
[im 1/32]
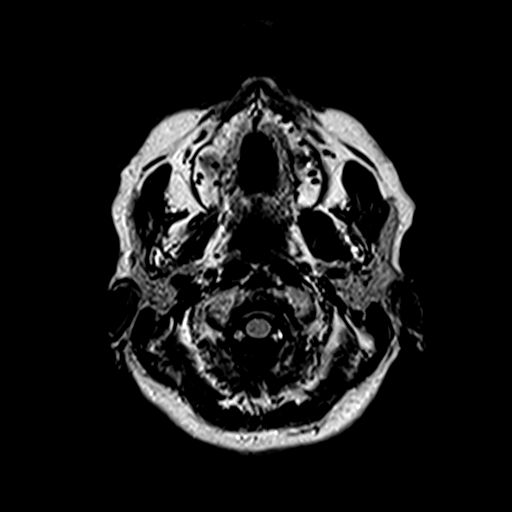
[im 32/32]
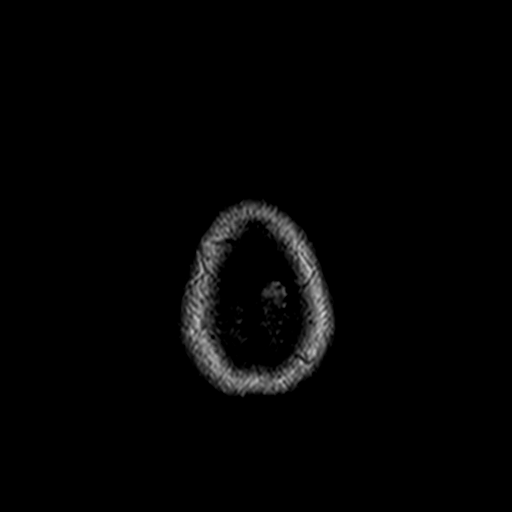

[Series 10: swi_images · axial · 5.0mm · 0.90mm/px · z∈[-64,+81]mm · 2 of 30 slices shown]
[im 1/30]
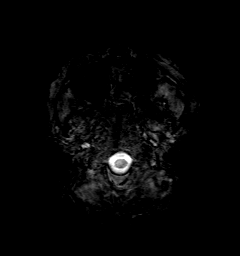
[im 30/30]
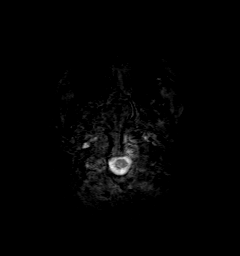

[Series 11: t1_mpr_tra · axial · 1.0mm · 0.75mm/px · z∈[-62,+80]mm · 10 of 144 slices shown (1 of 2)]
[im 1/144]
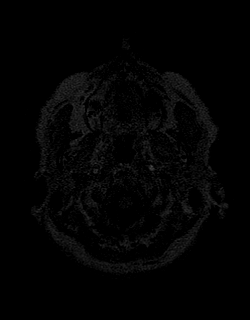
[im 16/144]
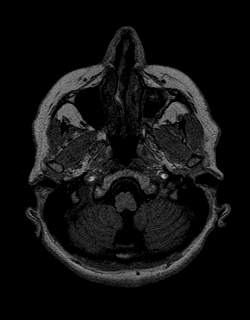
[im 32/144]
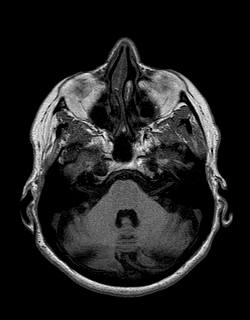
[im 48/144]
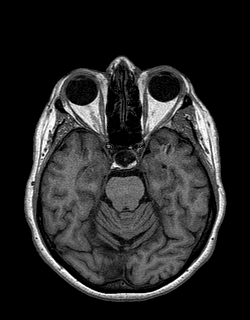
[im 64/144]
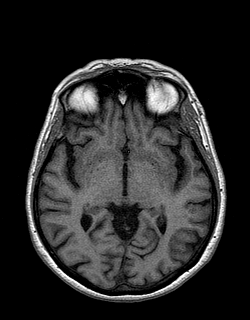
[im 80/144]
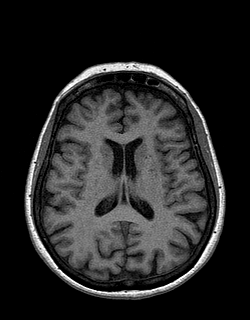
[im 96/144]
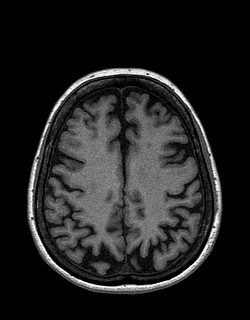
[im 112/144]
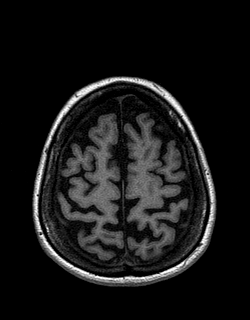
[im 128/144]
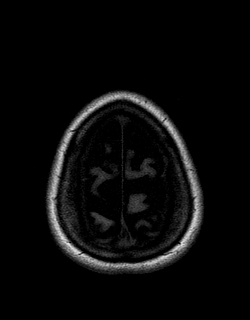
[im 144/144]
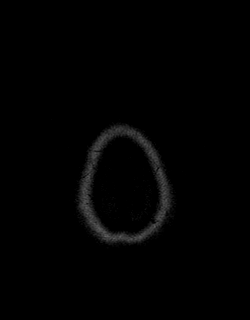

[Series 12: T2 · coronal · 5.0mm · 0.45mm/px · 2 of 25 slices shown (2 of 2)]
[im 1/25]
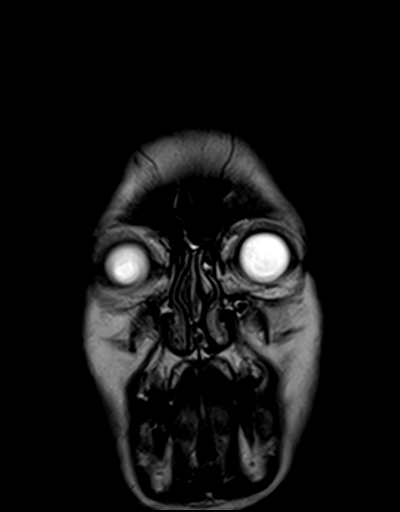
[im 25/25]
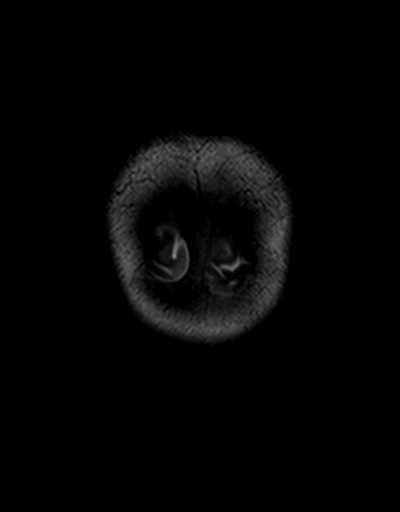

[Series 13: post cor · coronal · 5.0mm · 0.45mm/px · 2 of 25 slices shown]
[im 1/25]
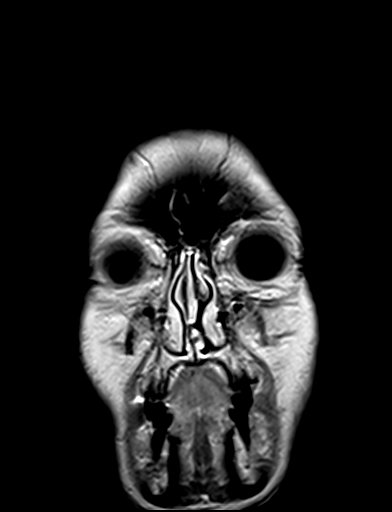
[im 25/25]
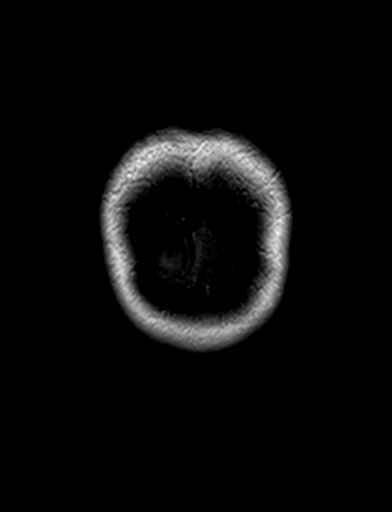

[Series 14: t1_mpr_tra · axial · 1.0mm · 0.75mm/px · z∈[-62,+80]mm · 10 of 144 slices shown (2 of 2)]
[im 1/144]
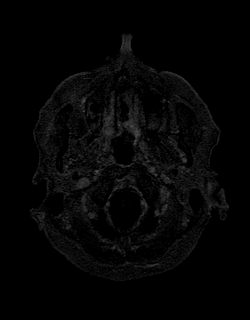
[im 16/144]
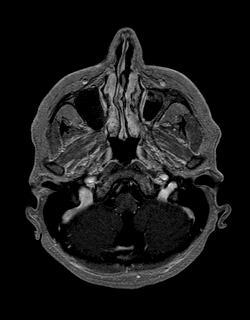
[im 32/144]
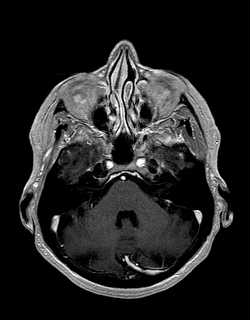
[im 48/144]
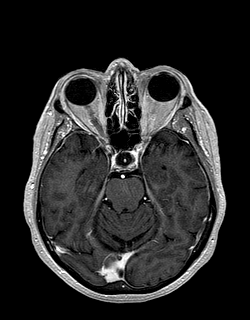
[im 64/144]
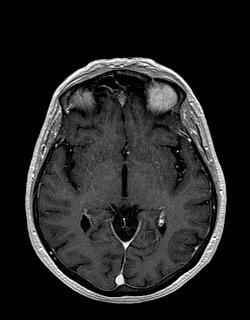
[im 80/144]
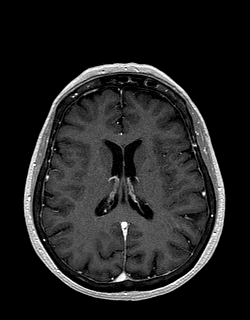
[im 96/144]
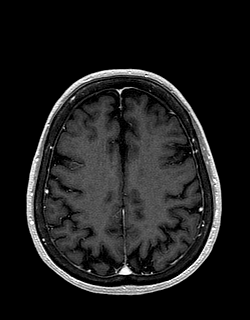
[im 112/144]
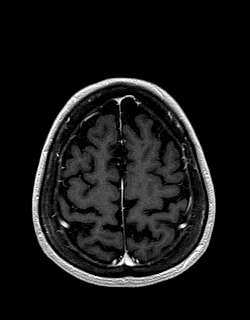
[im 128/144]
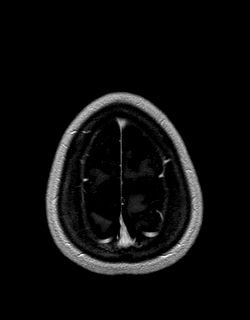
[im 144/144]
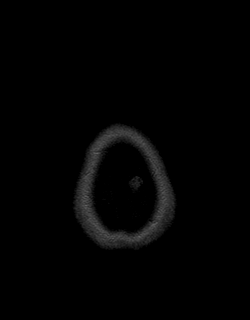

[48 of 48 positions shown; findings below may reference images not displayed]

FINDINGS: Brain: Ventricle size normal. Cerebral volume normal for age without
focal lobar atrophy.

Scattered small white matter hyperintensities bilaterally compatible
with chronic microvascular ischemia. No acute infarct. Negative for
hemorrhage or mass. No midline shift. Empty sella. Normal
enhancement postcontrast infusion.

Vascular: Normal arterial flow voids

Skull and upper cervical spine: Negative

Sinuses/Orbits: Mucosal edema paranasal sinuses with air-fluid level
left maxillary sinus. Bilateral cataract surgery

Other: None
IMPRESSION: No acute abnormality. Mild chronic microvascular ischemic change in
the white matter. Negative for hydrocephalus.

## 2019-06-04 ENCOUNTER — Other Ambulatory Visit: Payer: Self-pay | Admitting: Internal Medicine

## 2019-06-04 DIAGNOSIS — Z1231 Encounter for screening mammogram for malignant neoplasm of breast: Secondary | ICD-10-CM

## 2019-06-11 ENCOUNTER — Other Ambulatory Visit: Payer: Self-pay

## 2019-06-11 DIAGNOSIS — Z20822 Contact with and (suspected) exposure to covid-19: Secondary | ICD-10-CM

## 2019-06-11 DIAGNOSIS — R6889 Other general symptoms and signs: Secondary | ICD-10-CM | POA: Diagnosis not present

## 2019-06-13 LAB — NOVEL CORONAVIRUS, NAA: SARS-CoV-2, NAA: NOT DETECTED

## 2019-06-29 IMAGING — CT CT HEART MORP W/ CTA COR W/ SCORE W/ CA W/CM &/OR W/O CM
4 of 7 series · 8 of 20 positions shown, 9 images · IV contrast (APPLIED)
Comparison: None.

CLINICAL DATA: 76F with atypical chest pain.

EXAM:
Cardiac/Coronary  CT
TECHNIQUE: The patient was scanned on a Phillips Force scanner.

[Series 7: best diast 74 % · axial · 0.33mm/px · z∈[+1125,+1168]mm · 2 of 321 slices shown, 3 images]
[im 107/321  vessel]
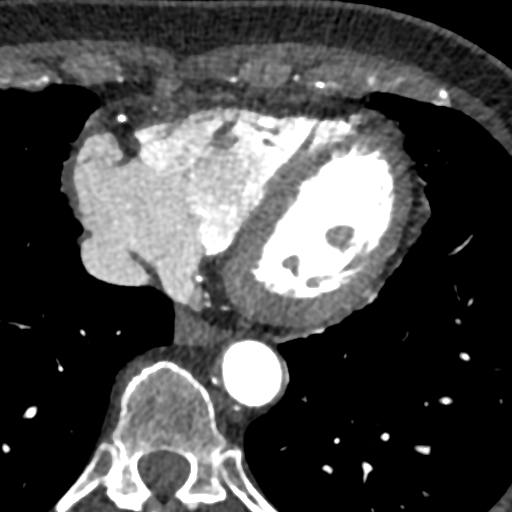
[im 107/321  lung]
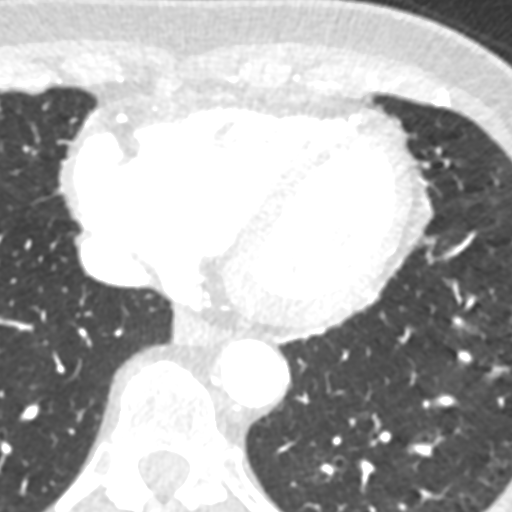
[im 214/321  vessel]
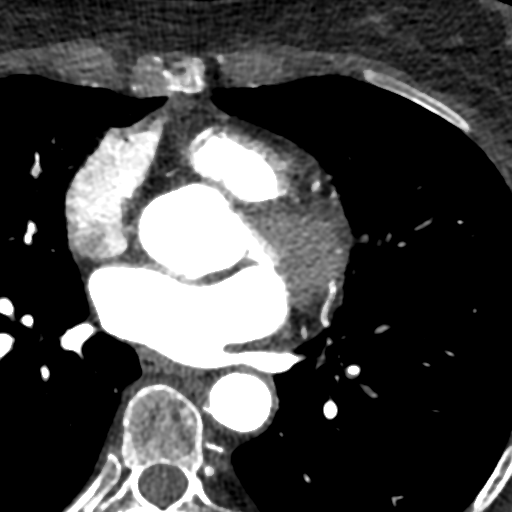

[Series 8: best syst 36 % · axial · 0.33mm/px · z∈[+1125,+1168]mm · 2 of 321 slices shown]
[im 107/321  vessel]
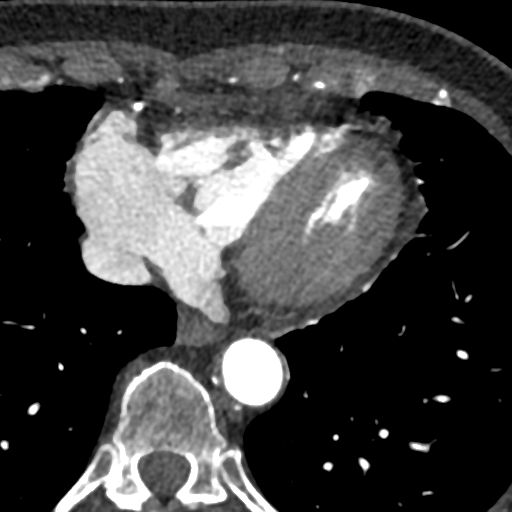
[im 214/321  vessel]
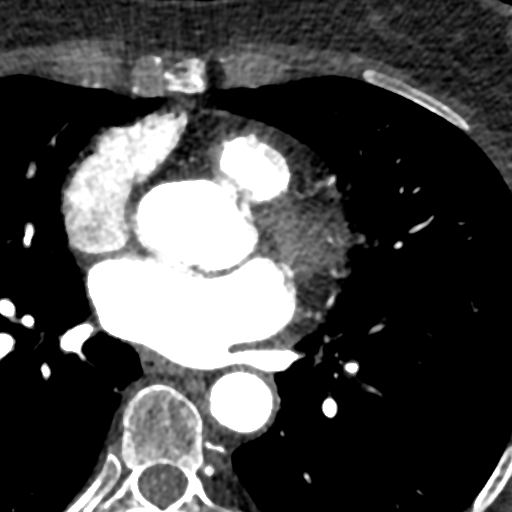

[Series 9: ts diast sharp 74 % · axial · 0.33mm/px · z∈[+1125,+1168]mm · 2 of 321 slices shown]
[im 107/321  lung]
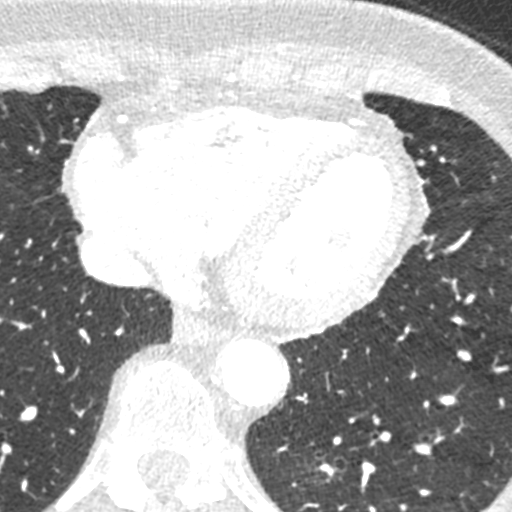
[im 214/321  lung]
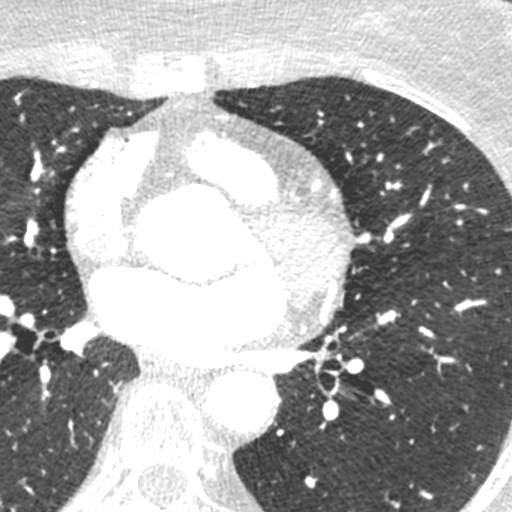

[Series 10: ts syst sharp 36 % · axial · 0.33mm/px · z∈[+1125,+1168]mm · 2 of 321 slices shown]
[im 107/321  lung]
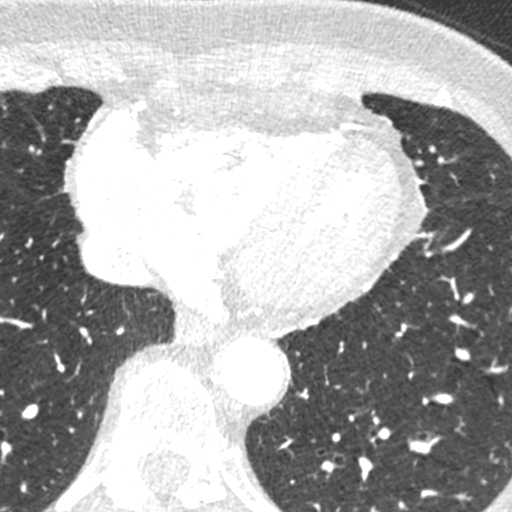
[im 214/321  lung]
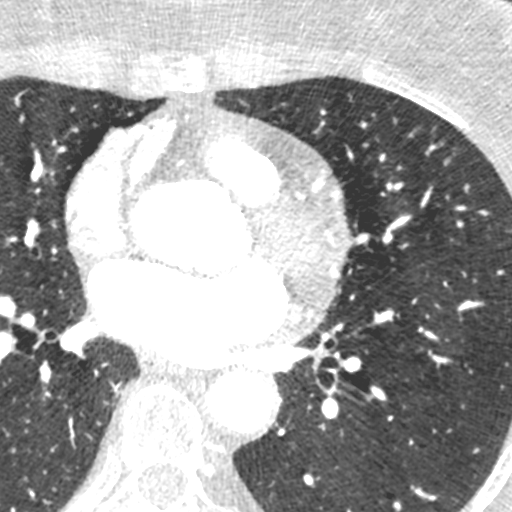

[8 of 20 positions shown; findings below may reference images not displayed]



Aorta:  Normal size.  No calcifications.  No dissection.

Aortic Valve:  Trileaflet.  No calcifications.

Coronary Arteries:  Normal coronary origin.  Right dominance.

RCA is a large dominant artery that gives rise to PDA and PLVB.
There is no plaque.

Left main is a large artery that gives rise to LAD and LCX arteries.
There is no plaque.

LAD is a large vessel that gives rise to a large, branching D1 and a
normal size D2. It has no plaque.

LCX is a small, non-dominant artery.  There is no plaque.

Other findings:

Normal pulmonary vein drainage into the left atrium.

Normal let atrial appendage without a thrombus.

Normal size of the pulmonary artery.
IMPRESSION: 1. Coronary calcium score of 0. This was 0 percentile for age and
sex matched control.

2. Normal coronary origin with right dominance.

3. No evidence of CAD.

4.  Consider non-cardiac causes of chest pain.

EXAM:
OVER-READ INTERPRETATION  CT CHEST

The following report is an over-read performed by radiologist Dr.
Samaria Javed [[email or web address]] on 06/30/2018. This over-read
does not include interpretation of cardiac or coronary anatomy or
pathology. The coronary CTA interpretation by the cardiologist is
attached.
FINDINGS: Vascular: Heart is normal size.  Visualized aorta is normal caliber.

Mediastinum/Nodes: No adenopathy in the lower mediastinum or hila.

Lungs/Pleura: Visualized lungs clear.  No effusions.

Upper Abdomen: Imaging into the upper abdomen shows no acute
findings.

Musculoskeletal: Chest wall soft tissues are unremarkable. No acute
bony abnormality.
IMPRESSION: No acute or significant extracardiac abnormality.

## 2019-06-29 IMAGING — US IR RADIOLOGY PERIPHERAL GUIDED IV START
1 series · 2 of 2 positions shown · non-contrast
Comparison: none

INDICATION: 76-year-old female presents for CT angiogram cardiac study. She has
been referred for ultrasound-guided venous access

[Series 1: ir (id) (id)/(id)/(id) · 2 of 2 slices shown]
[im 1/2]
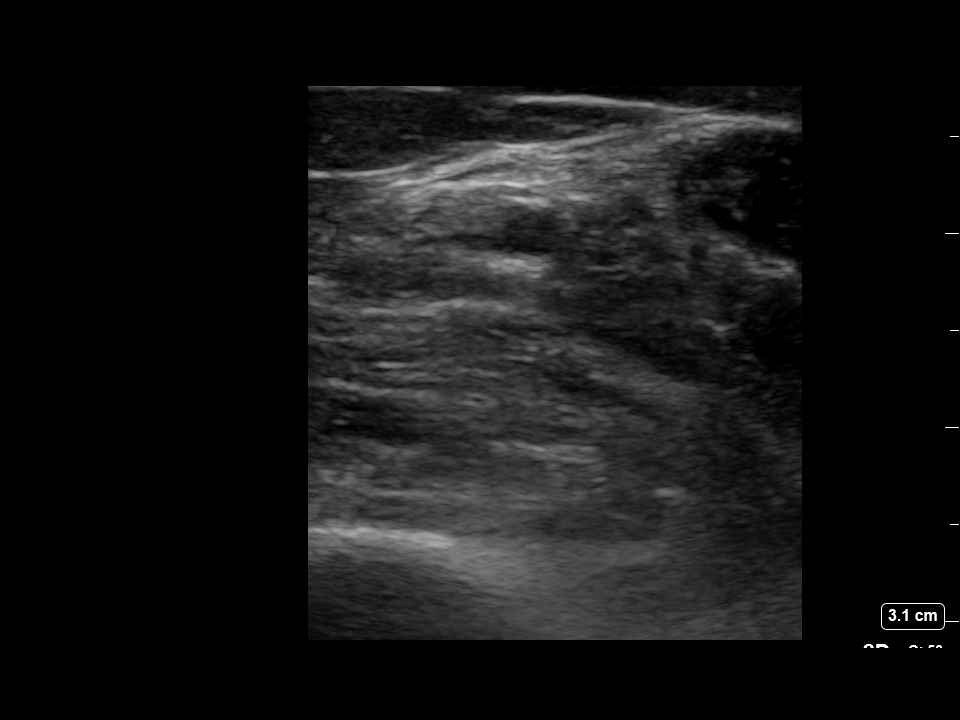
[im 2/2]
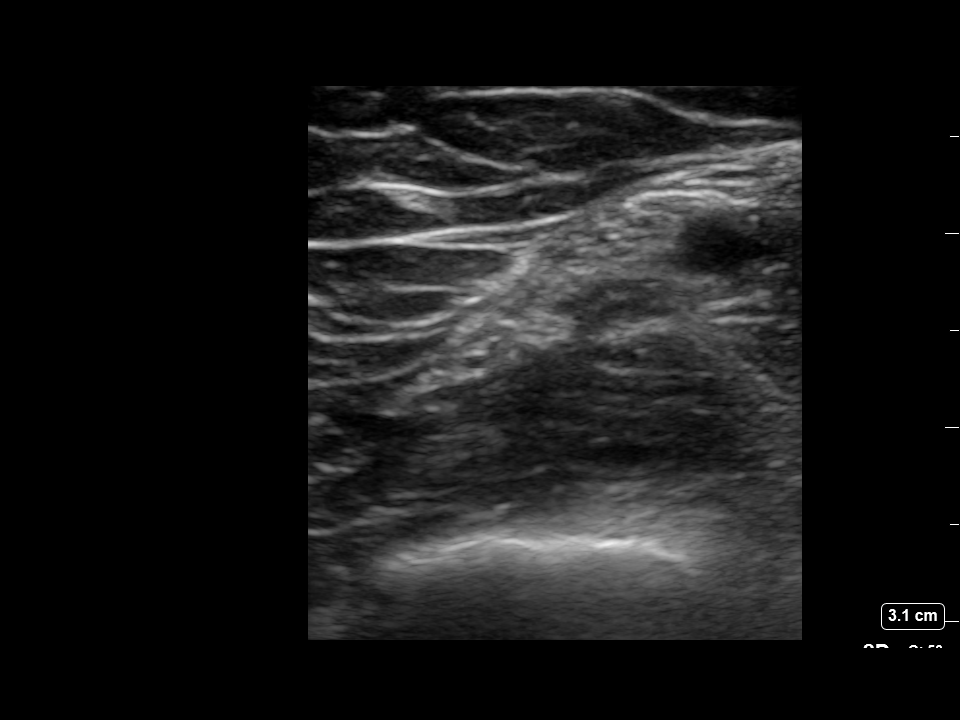

[2 of 2 positions shown; findings below may reference images not displayed]

EXAM:
ULTRASOUND-GUIDED VENA PUNCTURE

MEDICATIONS:
None

ANESTHESIA/SEDATION:
None

FLUOROSCOPY TIME:  Ultrasound).

COMPLICATIONS:
None

PROCEDURE:
Informed written consent was obtained from the patient after a
thorough discussion of the procedural risks, benefits and
alternatives. All questions were addressed. Sterile Barrier
Technique was utilized including caps, mask, sterile gloves, sterile
drape, hand hygiene and skin antiseptic. A timeout was performed
prior to the initiation of the procedure.

Ultrasound survey of the upper extremity was performed with images
stored and sent to PACs.

A micropuncture needle was used access the left antebrachial
superficial vein under ultrasound. With venous blood flow returned,
an .018 micro wire was passed through the needle into the vein. The
needle was removed, and a micropuncture sheath was placed over the
wire.

The inner dilator and wire were removed, and the catheter was
secured in position after attaching to saline flush.

Patient tolerated the procedure well and remained hemodynamically
stable throughout.

No complications were encountered and no significant blood loss.
IMPRESSION: Status post left upper extremity ultrasound-guided vena puncture.

## 2019-07-12 ENCOUNTER — Other Ambulatory Visit: Payer: Self-pay

## 2019-07-12 DIAGNOSIS — Z20822 Contact with and (suspected) exposure to covid-19: Secondary | ICD-10-CM

## 2019-07-13 LAB — NOVEL CORONAVIRUS, NAA: SARS-CoV-2, NAA: NOT DETECTED

## 2019-07-19 DIAGNOSIS — R82998 Other abnormal findings in urine: Secondary | ICD-10-CM | POA: Diagnosis not present

## 2019-07-19 DIAGNOSIS — E7849 Other hyperlipidemia: Secondary | ICD-10-CM | POA: Diagnosis not present

## 2019-07-19 DIAGNOSIS — E038 Other specified hypothyroidism: Secondary | ICD-10-CM | POA: Diagnosis not present

## 2019-07-20 ENCOUNTER — Other Ambulatory Visit: Payer: Self-pay

## 2019-07-20 ENCOUNTER — Ambulatory Visit
Admission: RE | Admit: 2019-07-20 | Discharge: 2019-07-20 | Disposition: A | Discharge status: Home / Self Care | Payer: Medicare Other | Source: Ambulatory Visit | Attending: Internal Medicine | Admitting: Internal Medicine

## 2019-07-20 DIAGNOSIS — Z1231 Encounter for screening mammogram for malignant neoplasm of breast: Secondary | ICD-10-CM | POA: Diagnosis not present

## 2019-07-20 DIAGNOSIS — Z23 Encounter for immunization: Secondary | ICD-10-CM | POA: Diagnosis not present

## 2019-07-22 DIAGNOSIS — R42 Dizziness and giddiness: Secondary | ICD-10-CM | POA: Diagnosis not present

## 2019-07-22 DIAGNOSIS — E785 Hyperlipidemia, unspecified: Secondary | ICD-10-CM | POA: Diagnosis not present

## 2019-07-22 DIAGNOSIS — J302 Other seasonal allergic rhinitis: Secondary | ICD-10-CM | POA: Diagnosis not present

## 2019-07-22 DIAGNOSIS — Z0000 Encounter for general adult medical examination without abnormal findings: Secondary | ICD-10-CM | POA: Diagnosis not present

## 2019-07-22 DIAGNOSIS — R2689 Other abnormalities of gait and mobility: Secondary | ICD-10-CM | POA: Diagnosis not present

## 2019-07-22 DIAGNOSIS — H8190 Unspecified disorder of vestibular function, unspecified ear: Secondary | ICD-10-CM | POA: Diagnosis not present

## 2019-07-22 DIAGNOSIS — N952 Postmenopausal atrophic vaginitis: Secondary | ICD-10-CM | POA: Diagnosis not present

## 2019-07-22 DIAGNOSIS — M2010 Hallux valgus (acquired), unspecified foot: Secondary | ICD-10-CM | POA: Diagnosis not present

## 2019-07-22 DIAGNOSIS — K219 Gastro-esophageal reflux disease without esophagitis: Secondary | ICD-10-CM | POA: Diagnosis not present

## 2019-07-22 DIAGNOSIS — R0602 Shortness of breath: Secondary | ICD-10-CM | POA: Diagnosis not present

## 2019-07-22 DIAGNOSIS — E039 Hypothyroidism, unspecified: Secondary | ICD-10-CM | POA: Diagnosis not present

## 2019-07-22 DIAGNOSIS — R413 Other amnesia: Secondary | ICD-10-CM | POA: Diagnosis not present

## 2019-07-23 DIAGNOSIS — Z1212 Encounter for screening for malignant neoplasm of rectum: Secondary | ICD-10-CM | POA: Diagnosis not present

## 2019-07-29 ENCOUNTER — Encounter (HOSPITAL_COMMUNITY): Payer: Self-pay

## 2019-07-29 ENCOUNTER — Ambulatory Visit (HOSPITAL_COMMUNITY)
Admission: EM | Admit: 2019-07-29 | Discharge: 2019-07-29 | Disposition: A | Discharge status: Home / Self Care | Payer: Medicare Other | Attending: Family Medicine | Admitting: Family Medicine

## 2019-07-29 ENCOUNTER — Other Ambulatory Visit: Payer: Self-pay

## 2019-07-29 DIAGNOSIS — L509 Urticaria, unspecified: Secondary | ICD-10-CM

## 2019-07-29 MED ORDER — DEXAMETHASONE SODIUM PHOSPHATE 10 MG/ML IJ SOLN
10.0000 mg | Freq: Once | INTRAMUSCULAR | Status: AC
  Administered 2019-07-29: 10 mg via INTRAMUSCULAR

## 2019-07-29 MED ORDER — DEXAMETHASONE SODIUM PHOSPHATE 10 MG/ML IJ SOLN
INTRAMUSCULAR | Status: AC
  Filled 2019-07-29: qty 1

## 2019-07-29 NOTE — ED Triage Notes (Signed)
Pt presents to UC w/ c/o patchy itchy rash that is generalized on her arms and whole body since 2 days ago after planting in her garden. Pt reports taking benadryl the same day, the rash went away and came back.

## 2019-07-30 DIAGNOSIS — L509 Urticaria, unspecified: Secondary | ICD-10-CM | POA: Diagnosis not present

## 2019-08-02 ENCOUNTER — Encounter: Payer: Self-pay | Admitting: Podiatry

## 2019-08-03 NOTE — ED Provider Notes (Signed)
[place]   [id number] 07/29/19 Arrival Time: [id number]  ASSESSMENT & PLAN:  1. Urticaria     Meds ordered this encounter  Medications  . dexamethasone (DECADRON) injection 10 mg   Unknown trigger. Prefers IM steroid.  Will follow up with PCP or here if worsening or failing to improve as anticipated. Reviewed expectations re: course of current medical issues. Questions answered. Outlined signs and symptoms indicating need for more acute intervention. Patient verbalized understanding. After Visit Summary given.   SUBJECTIVE:  Leslie Willis is a 78 y.o. female who presents with a skin complaint.   Location: bilateral forearms Onset: abrupt Duration: 2 days2 Associated pruritis? minimal Associated pain? none Progression: fluctuating a bit  Drainage? none Known trigger? questions outdoor/plant exposure New soaps/lotions/topicals/detergents? No   Contacts with similar? No  Recent travel? No  Other associated symptoms: none Therapies tried thus far: Benadryl without much relief Arthralgia or myalgia? none Recent illness? none Fever? none New medications? none No specific aggravating or alleviating factors reported.  ROS: As per HPI.  OBJECTIVE: Vitals:   07/29/19 1849  BP: 128/64  Pulse: 64  Resp: 16  Temp: 98.1 F (36.7 C)  TempSrc: Oral  SpO2: 100%    General appearance: alert; no distress HEENT: [place]; AT Neck: supple with FROM Lungs: clear to auscultation bilaterally Heart: regular rate and rhythm Extremities: no edema; moves all extremities normally Skin: warm and dry; signs of infection: none; areas of linear papules and vesicles with surrounding erythema over bilateral forearms Psychological: alert and cooperative; normal mood and affect  Allergies  Allergen Reactions  . Sulfa Antibiotics Rash    Topical use    Past Medical History:  Diagnosis Date  . Atypical chest pain 05/27/2018  . GERD (gastroesophageal reflux disease)  05/27/2018  . Hypothyroidism 05/27/2018  . Pure hypercholesterolemia 05/27/2018   Social History   Socioeconomic History  . Marital status: Married    Spouse name: Not on file  . Number of children: Not on file  . Years of education: Not on file  . Highest education level: Not on file  Occupational History  . Not on file  Social Needs  . Financial resource strain: Not on file  . Food insecurity    Worry: Not on file    Inability: Not on file  . Transportation needs    Medical: Not on file    Non-medical: Not on file  Tobacco Use  . Smoking status: Never Smoker  . Smokeless tobacco: Never Used  Substance and Sexual Activity  . Alcohol use: Yes    Comment: rare  . Drug use: Never  . Sexual activity: Not on file  Lifestyle  . Physical activity    Days per week: Not on file    Minutes per session: Not on file  . Stress: Not on file  Relationships  . Social Herbalist on phone: Not on file    Gets together: Not on file    Attends religious service: Not on file    Active member of club or organization: Not on file    Attends meetings of clubs or organizations: Not on file    Relationship status: Not on file  . Intimate partner violence    Fear of current or ex partner: Not on file    Emotionally abused: Not on file    Physically abused: Not on file    Forced sexual activity: Not on file  Other Topics Concern  . Not  on file  Social History Narrative  . Not on file   Family History  Problem Relation Age of Onset  . Breast cancer Sister   . Breast cancer Sister   . CAD Mother   . Stroke Mother   . Heart attack Father   . Stroke Father   . Pulmonary embolism Father   . Stroke Paternal Grandmother    Past Surgical History:  Procedure Laterality Date  . IR RADIOLOGY PERIPHERAL GUIDED IV START  06/30/2018  . IR US GUIDE VASC ACCESS LEFT  06/30/2018     Vanessa Kick, MD 08/03/19 [phone]

## 2019-08-25 ENCOUNTER — Ambulatory Visit: Payer: Medicare Other | Admitting: Allergy

## 2019-12-16 ENCOUNTER — Other Ambulatory Visit (INDEPENDENT_AMBULATORY_CARE_PROVIDER_SITE_OTHER): Payer: Self-pay | Admitting: Family Medicine

## 2020-01-21 DIAGNOSIS — H26493 Other secondary cataract, bilateral: Secondary | ICD-10-CM | POA: Diagnosis not present

## 2020-01-21 DIAGNOSIS — H40013 Open angle with borderline findings, low risk, bilateral: Secondary | ICD-10-CM | POA: Diagnosis not present

## 2020-01-21 DIAGNOSIS — H524 Presbyopia: Secondary | ICD-10-CM | POA: Diagnosis not present

## 2020-01-21 DIAGNOSIS — H5310 Unspecified subjective visual disturbances: Secondary | ICD-10-CM | POA: Diagnosis not present

## 2020-01-24 ENCOUNTER — Telehealth: Payer: Self-pay

## 2020-01-24 NOTE — Telephone Encounter (Signed)
  Patient Consent for Virtual Visit         Leslie Willis has provided verbal consent on 01/24/2020 for a virtual visit (video or telephone).   CONSENT FOR VIRTUAL VISIT FOR:  Leslie Willis  By participating in this virtual visit I agree to the following:  I hereby voluntarily request, consent and authorize [facility] and its employed or contracted physicians, physician assistants, nurse practitioners or other licensed health care professionals (the Practitioner), to provide me with telemedicine health care services (the "Services") as deemed necessary by the treating Practitioner. I acknowledge and consent to receive the Services by the Practitioner via telemedicine. I understand that the telemedicine visit will involve communicating with the Practitioner through live audiovisual communication technology and the disclosure of certain medical information by electronic transmission. I acknowledge that I have been given the opportunity to request an in-person assessment or other available alternative prior to the telemedicine visit and am voluntarily participating in the telemedicine visit.  I understand that I have the right to withhold or withdraw my consent to the use of telemedicine in the course of my care at any time, without affecting my right to future care or treatment, and that the Practitioner or I may terminate the telemedicine visit at any time. I understand that I have the right to inspect all information obtained and/or recorded in the course of the telemedicine visit and may receive copies of available information for a reasonable fee.  I understand that some of the potential risks of receiving the Services via telemedicine include:  Marland Kitchen Delay or interruption in medical evaluation due to technological equipment failure or disruption; . Information transmitted may not be sufficient (e.g. poor resolution of images) to allow for appropriate medical decision making by the  Practitioner; and/or  . In rare instances, security protocols could fail, causing a breach of personal health information.  Furthermore, I acknowledge that it is my responsibility to provide information about my medical history, conditions and care that is complete and accurate to the best of my ability. I acknowledge that Practitioner's advice, recommendations, and/or decision may be based on factors not within their control, such as incomplete or inaccurate data provided by me or distortions of diagnostic images or specimens that may result from electronic transmissions. I understand that the practice of medicine is not an exact science and that Practitioner makes no warranties or guarantees regarding treatment outcomes. I acknowledge that a copy of this consent can be made available to me via my patient portal ([facility]), or I can request a printed copy by calling the office of [facility].    I understand that my insurance will be billed for this visit.   I have read or had this consent read to me. . I understand the contents of this consent, which adequately explains the benefits and risks of the Services being provided via telemedicine.  . I have been provided ample opportunity to ask questions regarding this consent and the Services and have had my questions answered to my satisfaction. . I give my informed consent for the services to be provided through the use of telemedicine in my medical care

## 2020-01-25 ENCOUNTER — Telehealth (INDEPENDENT_AMBULATORY_CARE_PROVIDER_SITE_OTHER): Payer: Medicare Other | Admitting: Cardiology

## 2020-01-25 ENCOUNTER — Encounter: Payer: Self-pay | Admitting: Cardiology

## 2020-01-25 DIAGNOSIS — R0789 Other chest pain: Secondary | ICD-10-CM

## 2020-01-25 DIAGNOSIS — I5189 Other ill-defined heart diseases: Secondary | ICD-10-CM | POA: Diagnosis not present

## 2020-01-25 NOTE — Patient Instructions (Signed)
Medication Instructions:  Continue same medications    Lab Work: None ordered    Testing/Procedures: None ordered   Follow-Up: At [facility], you and your health needs are our priority.  As part of our continuing mission to provide you with exceptional heart care, we have created designated Provider Care Teams.  These Care Teams include your primary Cardiologist (physician) and Advanced Practice Providers (APPs -  Physician Assistants and Nurse Practitioners) who all work together to provide you with the care you need, when you need it.  We recommend signing up for the patient portal called "MyChart".  Sign up information is provided on this After Visit Summary.  MyChart is used to connect with patients for Virtual Visits (Telemedicine).  Patients are able to view lab/test results, encounter notes, upcoming appointments, etc.  Non-urgent messages can be sent to your provider as well.   To learn more about what you can do with MyChart, go to [email or web address].    Your next appointment:  As needed

## 2020-01-25 NOTE — Progress Notes (Signed)
Virtual Visit via Telephone Note   This visit type was conducted due to national recommendations for restrictions regarding the COVID-19 Pandemic (e.g. social distancing) in an effort to limit this patient's exposure and mitigate transmission in our community.  Due to her co-morbid illnesses, this patient is at least at moderate risk for complications without adequate follow up.  This format is felt to be most appropriate for this patient at this time.  The patient did not have access to video technology/had technical difficulties with video requiring transitioning to audio format only (telephone).  All issues noted in this document were discussed and addressed.  No physical exam could be performed with this format.  Please refer to the patient's chart for her  consent to telehealth for [facility].   The patient was identified using 2 identifiers.  Date:  01/25/2020   ID:  Alger Memos, DOB 11/05/1940, MRN [id number]  Patient Location: Home Provider Location: Home  PCP:  Burnard Bunting, MD  Cardiologist:  Dr Oval Linsey Electrophysiologist:  None   Evaluation Performed:  Follow-Up Visit  Chief Complaint:  none  History of Present Illness:    HILDRED RYKS is a 79 y.o. female with a history of exertional arm pain in the past.  In August 2019 she had an echocardiogram that showed normal LV function with grade 2 diastolic dysfunction.  Coronary CTA was done in September 2019 that showed no coronary disease and a calcium score of 0.  She was contacted today for routine follow-up.  Since we saw her last she has had no further issues with exertional chest pain or arm pain.  She is done well through the Covid pandemic and is fully vaccinated.  She follows regularly with her primary care provider.  The patient does not have symptoms concerning for COVID-19 infection (fever, chills, cough, or new shortness of breath).    Past Medical History:  Diagnosis Date  . Atypical chest pain  05/27/2018  . GERD (gastroesophageal reflux disease) 05/27/2018  . Hypothyroidism 05/27/2018  . Pure hypercholesterolemia 05/27/2018   Past Surgical History:  Procedure Laterality Date  . IR RADIOLOGY PERIPHERAL GUIDED IV START  06/30/2018  . IR US GUIDE VASC ACCESS LEFT  06/30/2018     Current Meds  Medication Sig  . cholecalciferol (VITAMIN D) 1000 units tablet Take 1,000 Units by mouth daily.  Marland Kitchen co-enzyme Q-10 30 MG capsule Take 100 mg by mouth daily.  . Diclofenac Sodium CR 100 MG 24 hr tablet Take 1 tablet by mouth daily.  . fluticasone (FLONASE) 50 MCG/ACT nasal spray Use 2 squirts to each nostril 2 times daily for 7-10 days, then 2 squirts in each nare daily  . levocetirizine (XYZAL) 5 MG tablet Take 1 tablet (5 mg total) by mouth every evening.  . Multiple Vitamins-Minerals (OCUVITE EYE HEALTH FORMULA) CAPS Take 1 daily  . Omega-3 Fatty Acids (FISH OIL) 1000 MG CAPS Take 1 capsule by mouth daily.  . pantoprazole (PROTONIX) 40 MG tablet TAKE 1 TABLET BY MOUTH EVERY DAY FOR GERD  . simvastatin (ZOCOR) 10 MG tablet Take 10 mg by mouth at bedtime.  Marland Kitchen SYNTHROID 112 MCG tablet Take 1 tablet by mouth daily.  . [DISCONTINUED] DICLOFENAC PO Take by mouth.  . [DISCONTINUED] LEVOTHYROXINE SODIUM PO Take by mouth.  . [DISCONTINUED] LUTEIN PO Take 1 tablet by mouth daily.     Allergies:   Sulfa antibiotics   Social History   Tobacco Use  . Smoking status: Never Smoker  .  Smokeless tobacco: Never Used  Substance Use Topics  . Alcohol use: Yes    Comment: rare  . Drug use: Never     Family Hx: [name]; [name]; [name]; [name]; [name], mother, and paternal grandmother.  ROS:   Please see the history of present illness.    All other systems reviewed and are negative.   Prior CV studies:   The following studies were reviewed today: Echo Aug  2019 CTA Sept 2019  Labs/Other Tests and Data Reviewed:    EKG:  No ECG reviewed.  Recent Labs: No results found for requested labs within last 8760 hours.   Recent Lipid Panel No results found for: CHOL, TRIG, HDL, CHOLHDL, LDLCALC, LDLDIRECT  Wt Readings from Last 3 Encounters:  01/25/20 137 lb (62.1 kg)  06/26/18 138 lb 6.4 oz (62.8 kg)  05/19/18 139 lb (63 kg)     Objective:    Vital Signs:  BP 133/72   Pulse (!) 58   Ht 5\' 3"  (1.6 m)   Wt 137 lb (62.1 kg)   BMI 24.27 kg/m    VITAL SIGNS:  reviewed  ASSESSMENT & PLAN:    Exertional arm pain- Coronary CTA negative, Ca++ score 0- Sept [id number]  Diastolic dysfunction- Normal LVF with grade 2 DD on echo Aug 2019- no LVH noted- no symptoms of CHF   Plan:  PRN follow up  COVID-19 Education: The signs and symptoms of COVID-19 were discussed with the patient and how to seek care for testing (follow up with PCP or arrange E-visit).  The importance of social distancing was discussed today.  Time:   Today, I have spent 10 minutes with the patient with telehealth technology discussing the above problems.     Medication Adjustments/Labs and Tests Ordered: Current medicines are reviewed at length with the patient today.  Concerns regarding medicines are outlined above.   Tests Ordered: No orders of the defined types were placed in this encounter.   Medication Changes: No orders of the defined types were placed in this encounter.   Follow Up:  Either In Person or Virtual prn  Signed, [name], PA-C  01/25/2020 10:13 AM    [place]

## 2020-01-26 DIAGNOSIS — E039 Hypothyroidism, unspecified: Secondary | ICD-10-CM | POA: Diagnosis not present

## 2020-01-26 DIAGNOSIS — R413 Other amnesia: Secondary | ICD-10-CM | POA: Diagnosis not present

## 2020-01-26 DIAGNOSIS — J302 Other seasonal allergic rhinitis: Secondary | ICD-10-CM | POA: Diagnosis not present

## 2020-01-26 DIAGNOSIS — E785 Hyperlipidemia, unspecified: Secondary | ICD-10-CM | POA: Diagnosis not present

## 2020-02-11 DIAGNOSIS — H5310 Unspecified subjective visual disturbances: Secondary | ICD-10-CM | POA: Diagnosis not present

## 2020-02-15 DIAGNOSIS — H40023 Open angle with borderline findings, high risk, bilateral: Secondary | ICD-10-CM | POA: Diagnosis not present

## 2020-02-15 DIAGNOSIS — H43812 Vitreous degeneration, left eye: Secondary | ICD-10-CM | POA: Diagnosis not present

## 2020-02-15 DIAGNOSIS — H02831 Dermatochalasis of right upper eyelid: Secondary | ICD-10-CM | POA: Diagnosis not present

## 2020-02-15 DIAGNOSIS — H02834 Dermatochalasis of left upper eyelid: Secondary | ICD-10-CM | POA: Diagnosis not present

## 2020-02-29 DIAGNOSIS — J301 Allergic rhinitis due to pollen: Secondary | ICD-10-CM | POA: Diagnosis not present

## 2020-02-29 DIAGNOSIS — J3089 Other allergic rhinitis: Secondary | ICD-10-CM | POA: Diagnosis not present

## 2020-02-29 DIAGNOSIS — J4520 Mild intermittent asthma, uncomplicated: Secondary | ICD-10-CM | POA: Diagnosis not present

## 2020-02-29 DIAGNOSIS — L501 Idiopathic urticaria: Secondary | ICD-10-CM | POA: Diagnosis not present

## 2020-03-30 DIAGNOSIS — H04123 Dry eye syndrome of bilateral lacrimal glands: Secondary | ICD-10-CM | POA: Diagnosis not present

## 2020-04-27 DIAGNOSIS — M1712 Unilateral primary osteoarthritis, left knee: Secondary | ICD-10-CM | POA: Diagnosis not present

## 2020-04-27 DIAGNOSIS — M25562 Pain in left knee: Secondary | ICD-10-CM | POA: Diagnosis not present

## 2020-06-07 ENCOUNTER — Other Ambulatory Visit: Payer: Self-pay | Admitting: Internal Medicine

## 2020-06-07 DIAGNOSIS — Z1231 Encounter for screening mammogram for malignant neoplasm of breast: Secondary | ICD-10-CM

## 2020-06-13 DIAGNOSIS — H5310 Unspecified subjective visual disturbances: Secondary | ICD-10-CM | POA: Diagnosis not present

## 2020-06-13 DIAGNOSIS — H04123 Dry eye syndrome of bilateral lacrimal glands: Secondary | ICD-10-CM | POA: Diagnosis not present

## 2020-06-13 DIAGNOSIS — H40013 Open angle with borderline findings, low risk, bilateral: Secondary | ICD-10-CM | POA: Diagnosis not present

## 2020-07-06 DIAGNOSIS — Z23 Encounter for immunization: Secondary | ICD-10-CM | POA: Diagnosis not present

## 2020-07-25 ENCOUNTER — Other Ambulatory Visit: Payer: Self-pay

## 2020-07-25 ENCOUNTER — Ambulatory Visit
Admission: RE | Admit: 2020-07-25 | Discharge: 2020-07-25 | Disposition: A | Discharge status: Home / Self Care | Payer: Medicare Other | Source: Ambulatory Visit | Attending: Internal Medicine | Admitting: Internal Medicine

## 2020-07-25 DIAGNOSIS — Z1231 Encounter for screening mammogram for malignant neoplasm of breast: Secondary | ICD-10-CM | POA: Diagnosis not present

## 2020-07-28 DIAGNOSIS — E039 Hypothyroidism, unspecified: Secondary | ICD-10-CM | POA: Diagnosis not present

## 2020-07-28 DIAGNOSIS — E785 Hyperlipidemia, unspecified: Secondary | ICD-10-CM | POA: Diagnosis not present

## 2020-07-28 DIAGNOSIS — M859 Disorder of bone density and structure, unspecified: Secondary | ICD-10-CM | POA: Diagnosis not present

## 2020-08-02 DIAGNOSIS — K219 Gastro-esophageal reflux disease without esophagitis: Secondary | ICD-10-CM | POA: Diagnosis not present

## 2020-08-02 DIAGNOSIS — E663 Overweight: Secondary | ICD-10-CM | POA: Diagnosis not present

## 2020-08-02 DIAGNOSIS — E785 Hyperlipidemia, unspecified: Secondary | ICD-10-CM | POA: Diagnosis not present

## 2020-08-02 DIAGNOSIS — Z0000 Encounter for general adult medical examination without abnormal findings: Secondary | ICD-10-CM | POA: Diagnosis not present

## 2020-08-02 DIAGNOSIS — E039 Hypothyroidism, unspecified: Secondary | ICD-10-CM | POA: Diagnosis not present

## 2020-08-02 DIAGNOSIS — R82998 Other abnormal findings in urine: Secondary | ICD-10-CM | POA: Diagnosis not present

## 2020-08-03 DIAGNOSIS — Z1212 Encounter for screening for malignant neoplasm of rectum: Secondary | ICD-10-CM | POA: Diagnosis not present

## 2020-08-14 DIAGNOSIS — Z23 Encounter for immunization: Secondary | ICD-10-CM | POA: Diagnosis not present

## 2020-10-05 ENCOUNTER — Other Ambulatory Visit: Payer: Medicare Other

## 2020-10-05 DIAGNOSIS — Z20822 Contact with and (suspected) exposure to covid-19: Secondary | ICD-10-CM | POA: Diagnosis not present

## 2020-10-07 LAB — NOVEL CORONAVIRUS, NAA: SARS-CoV-2, NAA: NOT DETECTED

## 2020-10-07 LAB — SARS-COV-2, NAA 2 DAY TAT

## 2020-10-09 ENCOUNTER — Other Ambulatory Visit: Payer: Medicare Other

## 2020-10-09 DIAGNOSIS — Z20822 Contact with and (suspected) exposure to covid-19: Secondary | ICD-10-CM

## 2020-10-10 LAB — NOVEL CORONAVIRUS, NAA: SARS-CoV-2, NAA: NOT DETECTED

## 2020-10-10 LAB — SARS-COV-2, NAA 2 DAY TAT

## 2020-10-24 DIAGNOSIS — H9190 Unspecified hearing loss, unspecified ear: Secondary | ICD-10-CM | POA: Diagnosis not present

## 2020-12-19 DIAGNOSIS — H40013 Open angle with borderline findings, low risk, bilateral: Secondary | ICD-10-CM | POA: Diagnosis not present

## 2020-12-19 DIAGNOSIS — H04123 Dry eye syndrome of bilateral lacrimal glands: Secondary | ICD-10-CM | POA: Diagnosis not present

## 2021-01-29 DIAGNOSIS — E785 Hyperlipidemia, unspecified: Secondary | ICD-10-CM | POA: Diagnosis not present

## 2021-01-29 DIAGNOSIS — J302 Other seasonal allergic rhinitis: Secondary | ICD-10-CM | POA: Diagnosis not present

## 2021-01-29 DIAGNOSIS — K219 Gastro-esophageal reflux disease without esophagitis: Secondary | ICD-10-CM | POA: Diagnosis not present

## 2021-01-29 DIAGNOSIS — E663 Overweight: Secondary | ICD-10-CM | POA: Diagnosis not present

## 2021-01-29 DIAGNOSIS — E039 Hypothyroidism, unspecified: Secondary | ICD-10-CM | POA: Diagnosis not present

## 2021-02-01 DIAGNOSIS — H53483 Generalized contraction of visual field, bilateral: Secondary | ICD-10-CM | POA: Diagnosis not present

## 2021-02-01 DIAGNOSIS — H0279 Other degenerative disorders of eyelid and periocular area: Secondary | ICD-10-CM | POA: Diagnosis not present

## 2021-02-01 DIAGNOSIS — H57813 Brow ptosis, bilateral: Secondary | ICD-10-CM | POA: Diagnosis not present

## 2021-02-01 DIAGNOSIS — H02831 Dermatochalasis of right upper eyelid: Secondary | ICD-10-CM | POA: Diagnosis not present

## 2021-02-01 DIAGNOSIS — H02834 Dermatochalasis of left upper eyelid: Secondary | ICD-10-CM | POA: Diagnosis not present

## 2021-02-27 DIAGNOSIS — J301 Allergic rhinitis due to pollen: Secondary | ICD-10-CM | POA: Diagnosis not present

## 2021-02-27 DIAGNOSIS — J3089 Other allergic rhinitis: Secondary | ICD-10-CM | POA: Diagnosis not present

## 2021-02-27 DIAGNOSIS — L501 Idiopathic urticaria: Secondary | ICD-10-CM | POA: Diagnosis not present

## 2021-02-27 DIAGNOSIS — J4520 Mild intermittent asthma, uncomplicated: Secondary | ICD-10-CM | POA: Diagnosis not present

## 2021-06-11 DIAGNOSIS — Z20822 Contact with and (suspected) exposure to covid-19: Secondary | ICD-10-CM | POA: Diagnosis not present

## 2021-06-12 ENCOUNTER — Other Ambulatory Visit: Payer: Self-pay | Admitting: Internal Medicine

## 2021-06-12 DIAGNOSIS — Z1231 Encounter for screening mammogram for malignant neoplasm of breast: Secondary | ICD-10-CM

## 2021-06-14 DIAGNOSIS — U071 COVID-19: Secondary | ICD-10-CM | POA: Diagnosis not present

## 2021-06-14 DIAGNOSIS — Z20822 Contact with and (suspected) exposure to covid-19: Secondary | ICD-10-CM | POA: Diagnosis not present

## 2021-06-22 DIAGNOSIS — U071 COVID-19: Secondary | ICD-10-CM | POA: Diagnosis not present

## 2021-07-03 DIAGNOSIS — H26492 Other secondary cataract, left eye: Secondary | ICD-10-CM | POA: Diagnosis not present

## 2021-07-03 DIAGNOSIS — H40013 Open angle with borderline findings, low risk, bilateral: Secondary | ICD-10-CM | POA: Diagnosis not present

## 2021-07-03 DIAGNOSIS — H5310 Unspecified subjective visual disturbances: Secondary | ICD-10-CM | POA: Diagnosis not present

## 2021-07-14 DIAGNOSIS — Z20822 Contact with and (suspected) exposure to covid-19: Secondary | ICD-10-CM | POA: Diagnosis not present

## 2021-07-16 DIAGNOSIS — Z23 Encounter for immunization: Secondary | ICD-10-CM | POA: Diagnosis not present

## 2021-07-24 DIAGNOSIS — H5310 Unspecified subjective visual disturbances: Secondary | ICD-10-CM | POA: Diagnosis not present

## 2021-07-24 DIAGNOSIS — H40013 Open angle with borderline findings, low risk, bilateral: Secondary | ICD-10-CM | POA: Diagnosis not present

## 2021-07-26 ENCOUNTER — Ambulatory Visit
Admission: RE | Admit: 2021-07-26 | Discharge: 2021-07-26 | Disposition: A | Discharge status: Home / Self Care | Payer: Medicare Other | Source: Ambulatory Visit | Attending: Internal Medicine | Admitting: Internal Medicine

## 2021-07-26 ENCOUNTER — Ambulatory Visit: Payer: Medicare Other

## 2021-07-26 ENCOUNTER — Other Ambulatory Visit: Payer: Self-pay

## 2021-07-26 DIAGNOSIS — Z1231 Encounter for screening mammogram for malignant neoplasm of breast: Secondary | ICD-10-CM | POA: Diagnosis not present

## 2021-07-31 DIAGNOSIS — E039 Hypothyroidism, unspecified: Secondary | ICD-10-CM | POA: Diagnosis not present

## 2021-07-31 DIAGNOSIS — M859 Disorder of bone density and structure, unspecified: Secondary | ICD-10-CM | POA: Diagnosis not present

## 2021-07-31 DIAGNOSIS — E785 Hyperlipidemia, unspecified: Secondary | ICD-10-CM | POA: Diagnosis not present

## 2021-08-01 DIAGNOSIS — H6121 Impacted cerumen, right ear: Secondary | ICD-10-CM | POA: Diagnosis not present

## 2021-08-01 DIAGNOSIS — H9193 Unspecified hearing loss, bilateral: Secondary | ICD-10-CM | POA: Diagnosis not present

## 2021-08-08 ENCOUNTER — Telehealth: Payer: Self-pay

## 2021-08-08 DIAGNOSIS — Z0000 Encounter for general adult medical examination without abnormal findings: Secondary | ICD-10-CM | POA: Diagnosis not present

## 2021-08-08 DIAGNOSIS — E039 Hypothyroidism, unspecified: Secondary | ICD-10-CM | POA: Diagnosis not present

## 2021-08-08 DIAGNOSIS — K219 Gastro-esophageal reflux disease without esophagitis: Secondary | ICD-10-CM | POA: Diagnosis not present

## 2021-08-08 DIAGNOSIS — R82998 Other abnormal findings in urine: Secondary | ICD-10-CM | POA: Diagnosis not present

## 2021-08-08 DIAGNOSIS — Z1331 Encounter for screening for depression: Secondary | ICD-10-CM | POA: Diagnosis not present

## 2021-08-08 DIAGNOSIS — E785 Hyperlipidemia, unspecified: Secondary | ICD-10-CM | POA: Diagnosis not present

## 2021-08-08 DIAGNOSIS — Z1339 Encounter for screening examination for other mental health and behavioral disorders: Secondary | ICD-10-CM | POA: Diagnosis not present

## 2021-08-08 DIAGNOSIS — R413 Other amnesia: Secondary | ICD-10-CM | POA: Diagnosis not present

## 2021-08-08 DIAGNOSIS — E663 Overweight: Secondary | ICD-10-CM | POA: Diagnosis not present

## 2021-08-08 NOTE — Telephone Encounter (Signed)
Received a call from Dr. Jacquiline Doe office. Per the nurse patient presents with DOE with sob, chest pressure/pain." He wasn't the pt to be evaluated soon. Pt added to Dr. Blenda Mounts schedule to be seen Friday.

## 2021-08-10 ENCOUNTER — Other Ambulatory Visit: Payer: Self-pay

## 2021-08-10 ENCOUNTER — Encounter (HOSPITAL_BASED_OUTPATIENT_CLINIC_OR_DEPARTMENT_OTHER): Payer: Self-pay | Admitting: Cardiovascular Disease

## 2021-08-10 ENCOUNTER — Ambulatory Visit (INDEPENDENT_AMBULATORY_CARE_PROVIDER_SITE_OTHER): Payer: Medicare Other | Admitting: Cardiovascular Disease

## 2021-08-10 VITALS — BP 112/60 | HR 65 | Ht 63.0 in | Wt 137.2 lb

## 2021-08-10 DIAGNOSIS — E7800 Pure hypercholesterolemia, unspecified: Secondary | ICD-10-CM | POA: Diagnosis not present

## 2021-08-10 DIAGNOSIS — R0789 Other chest pain: Secondary | ICD-10-CM

## 2021-08-10 NOTE — Assessment & Plan Note (Signed)
Continue simvastatin.  This is managed by Dr. Reynaldo Minium.

## 2021-08-10 NOTE — Assessment & Plan Note (Signed)
She has been experiencing chest tightness and arm discomfort walking up an incline on her daily walks.  She is able to do her other little exercise classes without difficulty.  I suspect that her symptoms are not from her coronaries given that she had a normal CTA in 2019.  However, given the exertional nature of her symptoms we will get an ETT to see if we can recreate it and assess her EKG at the same time.  It is unclear if this could also be related to GERD or musculoskeletal.  There was no mention of any significant cervical or thoracic pathology on prior CT imaging.

## 2021-08-10 NOTE — Patient Instructions (Signed)
Medication Instructions:  Your physician recommends that you continue on your current medications as directed. Please refer to the Current Medication list given to you today.   Labwork: NONE  Testing/Procedures: Your physician has requested that you have an exercise tolerance test. For further information please visit [email or web address]. Please also follow instruction sheet, as given.  Follow-Up: NONE

## 2021-08-10 NOTE — Progress Notes (Signed)
Cardiology Office Note   Date:  08/10/2021   ID:  Leslie Willis, DOB 01/04/1941, MRN [id number]  PCP:  Burnard Bunting, MD  Cardiologist:   Skeet Latch, MD   No chief complaint on file.    History of Present Illness: Leslie Willis is a 80 y.o. female with hyperlipidemia, hypothyroidism and GERD here for follow-up.  She was initially seen 05/2017 for the evaluation of exertional arm discomfort.  Ms. Lave has noted that she sometimes gets cramps in her arms when walking up and down hills.  She does not have it when walking on flat land.  She was referred for an echocardiogram 05/2018 that revealed LVEF 60 to 65% with grade 2 diastolic dysfunction.  There is no evidence of volume overload.  She was also referred for cardiac CT-A 06/2018 that revealed no CAD. She saw Leslie Ransom, PA-C virtually 01/2020 and was doing well.  She has been feeling well overall. While walking at a normal pace for her, as she climbs up an incline she develops central chest pain that radiates to her left side. She has associated shortness of breath as well. Typically she will stop walking until her symptoms resolve. For years, she has also experienced random episodes of shortness of breath that occur while sitting at rest. At these times she feels unable to catch her breath, and will try to yawn in order to take a full breath. Additionally, she has occasionally muscle cramping in her arms and across her back that she describes as crippling. These cramps have a slow onset, and she is forced to lean forward due to the pain. They resolve gradually and spontaneously. Lately she also notes issues with belching frequently, but denies any heartburn. With her husband she participates in exercise classes a few times a week, and is often active with yard work. She denies any chest pains or muscle cramps with these activities. She denies any palpitations, lightheadedness, headaches, syncope, orthopnea, PND, or lower  extremity edema.     Past Medical History:  Diagnosis Date   Atypical chest pain 05/27/2018   GERD (gastroesophageal reflux disease) 05/27/2018   Hypothyroidism 05/27/2018   Pure hypercholesterolemia 05/27/2018    Past Surgical History:  Procedure Laterality Date   IR RADIOLOGY PERIPHERAL GUIDED IV START  06/30/2018   IR US GUIDE VASC ACCESS LEFT  06/30/2018     Current Outpatient Medications  Medication Sig Dispense Refill   cholecalciferol (VITAMIN D) 1000 units tablet Take 1,000 Units by mouth daily.     co-enzyme Q-10 30 MG capsule Take 100 mg by mouth daily.     Diclofenac Sodium CR 100 MG 24 hr tablet Take 1 tablet by mouth daily.     fluticasone (FLONASE) 50 MCG/ACT nasal spray Use 2 squirts to each nostril 2 times daily for 7-10 days, then 2 squirts in each nare daily     levocetirizine (XYZAL) 5 MG tablet Take 1 tablet (5 mg total) by mouth every evening.     levothyroxine (SYNTHROID) 100 MCG tablet Take 100 mcg by mouth daily.     Multiple Vitamins-Minerals (OCUVITE EYE HEALTH FORMULA) CAPS Take 1 daily     Omega-3 Fatty Acids (FISH OIL) 1000 MG CAPS Take 1 capsule by mouth daily.     simvastatin (ZOCOR) 10 MG tablet Take 10 mg by mouth at bedtime.  3   No current facility-administered medications for this visit.    Allergies:   Sulfa antibiotics    Social History:  The patient  reports that she has never smoked. She has never used smokeless tobacco. She reports current alcohol use. She reports that she does not use drugs.   Family History:  [name]; [name]; [name]; [name]; [name], mother, and paternal grandmother.    ROS:   Please see the history of present illness. (+) Central chest pain (+) Shortness of breath (+) Muscle cramps, bilateral UE and back All other systems are reviewed and negative.    PHYSICAL EXAM: VS:  BP  112/60 (BP Location: Left Arm, Patient Position: Sitting, Cuff Size: Normal)   Pulse 65   Ht 5\' 3"  (1.6 m)   Wt 137 lb 3.2 oz (62.2 kg)   BMI 24.30 kg/m  , BMI Body mass index is 24.3 kg/m. GENERAL:  Well appearing HEENT:  Pupils equal round and reactive, fundi not visualized, oral mucosa unremarkable NECK:  No jugular venous distention, waveform within normal limits, carotid upstroke brisk and symmetric, no bruits, no thyromegaly LYMPHATICS:  No cervical adenopathy HEART:  RRR.  PMI not displaced or sustained,S1 and S2 within normal limits, no S3, no S4, no clicks, no rubs, no murmurs ABD:  Flat, positive bowel sounds normal in frequency in pitch, no bruits, no rebound, no guarding, no midline pulsatile mass, no hepatomegaly, no splenomegaly EXT:  2 plus pulses throughout, no edema, no cyanosis no clubbing SKIN:  No rashes no nodules NEURO:  Cranial nerves II through XII grossly intact, motor grossly intact throughout PSYCH:  Cognitively intact, oriented to person place and time   EKG:  08/10/2021: Sinus rhythm. Rate 65 bpm. 06/26/2018: sinus rhythm. Rate 66 bpm. Low voltage. Non-specific T wave abnormalities.   Coronary CTA with Calcium Score 06/30/2018: FINDINGS: A 120 kV prospective scan was triggered in the descending thoracic aorta at 111 HU's. Axial non-contrast 3 mm slices were carried out through the heart. The data set was analyzed on a dedicated work station and scored using the [place]. Gantry rotation speed was 250 msecs and collimation was .6 mm. No beta blockade and 0.8 mg of sl NTG was given. The 3D data set was reconstructed in 5% intervals of the 67-82 % of the R-R cycle. Diastolic phases were analyzed on a dedicated work station using MPR, MIP and VRT modes. The patient received 80 cc of contrast.   Aorta:  Normal size.  No calcifications.  No dissection.   Aortic Valve:  Trileaflet.  No calcifications.   Coronary Arteries:  Normal coronary origin.   Right dominance.   RCA is a large dominant artery that gives rise to PDA and PLVB. There is no plaque.   Left main is a large artery that gives rise to LAD and LCX arteries. There is no plaque.   LAD is a large vessel that gives rise to a large, branching D1 and a normal size D2. It has no plaque.   LCX is a small, non-dominant artery.  There is no plaque.   Other findings:   Normal pulmonary vein drainage into the left atrium.   Normal let atrial appendage without a thrombus.   Normal size of the pulmonary artery.   IMPRESSION: 1. Coronary calcium score of 0. This was 0 percentile for age and sex matched control.   2. Normal coronary origin with right dominance.   3. No evidence of CAD.   4.  Consider non-cardiac causes of chest pain.  Echo 05/28/18:  Study Conclusions   - Left ventricle: The cavity size was normal. Wall thickness was   normal. Systolic function was normal. The estimated ejection   fraction was in the range of 60% to 65%. Wall motion was normal;   there were no regional wall motion abnormalities. Features are   consistent with a pseudonormal left ventricular filling pattern,   with concomitant abnormal relaxation and increased filling   pressure (grade 2 diastolic dysfunction). - Aortic valve: Trileaflet; mildly thickened, mildly calcified   leaflets. - Mitral valve: There was trivial regurgitation. - Tricuspid valve: There was mild regurgitation. - Pulmonary arteries: Systolic pressure was mildly increased. PA   peak pressure: 37 mm Hg (S).  Recent Labs: No results found for requested labs within last 8760 hours.    Lipid Panel No results found for: CHOL, TRIG, HDL, CHOLHDL, VLDL, LDLCALC, LDLDIRECT    Wt Readings from Last 3 Encounters:  08/10/21 137 lb 3.2 oz (62.2 kg)  01/25/20 137 lb (62.1 kg)  06/26/18 138 lb 6.4 oz (62.8 kg)     ASSESSMENT AND PLAN:  Atypical chest pain She has been experiencing chest tightness and arm  discomfort walking up an incline on her daily walks.  She is able to do her other little exercise classes without difficulty.  I suspect that her symptoms are not from her coronaries given that she had a normal CTA in 2019.  However, given the exertional nature of her symptoms we will get an ETT to see if we can recreate it and assess her EKG at the same time.  It is unclear if this could also be related to GERD or musculoskeletal.  There was no mention of any significant cervical or thoracic pathology on prior CT imaging.  Pure hypercholesterolemia Continue simvastatin.  This is managed by Dr. Reynaldo Minium.  The following changes have been made:  no change  Labs/ tests ordered today include:   Orders Placed This Encounter  Procedures   Exercise Tolerance Test   EKG 12-Lead      Disposition:   FU with Rekita Miotke C. Oval Linsey, MD, [facility]  PRN.   I,Mathew Stumpf,acting as a Education administrator for Skeet Latch, MD.,have documented all relevant documentation on the behalf of Skeet Latch, MD,as directed by  Skeet Latch, MD while in the presence of Skeet Latch, MD.  I, [place] Oval Linsey, MD have reviewed all documentation for this visit.  The documentation of the exam, diagnosis, procedures, and orders on 08/10/2021 are all accurate and complete.   Signed, Eura Radabaugh C. Oval Linsey, MD, [facility]  08/10/2021 3:37 PM    [facility]

## 2021-08-14 DIAGNOSIS — Z20822 Contact with and (suspected) exposure to covid-19: Secondary | ICD-10-CM | POA: Diagnosis not present

## 2021-08-16 ENCOUNTER — Telehealth (HOSPITAL_COMMUNITY): Payer: Self-pay | Admitting: *Deleted

## 2021-08-16 NOTE — Telephone Encounter (Signed)
Close encounter 

## 2021-08-17 ENCOUNTER — Other Ambulatory Visit: Payer: Self-pay

## 2021-08-17 ENCOUNTER — Ambulatory Visit (HOSPITAL_COMMUNITY)
Admission: RE | Admit: 2021-08-17 | Discharge: 2021-08-17 | Disposition: A | Discharge status: Home / Self Care | Payer: Medicare Other | Source: Ambulatory Visit | Attending: Cardiology | Admitting: Cardiology

## 2021-08-17 DIAGNOSIS — R0789 Other chest pain: Secondary | ICD-10-CM | POA: Insufficient documentation

## 2021-08-17 LAB — EXERCISE TOLERANCE TEST
Angina Index: 2
Base ST Depression (mm): 0 mm
Estimated workload: 6.3
Exercise duration (min): 4 min
Exercise duration (sec): 26 s
MPHR: 140 {beats}/min
Peak HR: 142 {beats}/min
Percent HR: 101 %
Rest HR: 61 {beats}/min

## 2021-09-12 DIAGNOSIS — L738 Other specified follicular disorders: Secondary | ICD-10-CM | POA: Diagnosis not present

## 2021-09-12 DIAGNOSIS — D1801 Hemangioma of skin and subcutaneous tissue: Secondary | ICD-10-CM | POA: Diagnosis not present

## 2021-09-12 DIAGNOSIS — L821 Other seborrheic keratosis: Secondary | ICD-10-CM | POA: Diagnosis not present

## 2021-09-12 DIAGNOSIS — D2261 Melanocytic nevi of right upper limb, including shoulder: Secondary | ICD-10-CM | POA: Diagnosis not present

## 2021-09-18 DIAGNOSIS — K135 Oral submucous fibrosis: Secondary | ICD-10-CM | POA: Diagnosis not present

## 2021-10-10 DIAGNOSIS — Z124 Encounter for screening for malignant neoplasm of cervix: Secondary | ICD-10-CM | POA: Diagnosis not present

## 2021-10-10 DIAGNOSIS — L739 Follicular disorder, unspecified: Secondary | ICD-10-CM | POA: Diagnosis not present

## 2021-10-30 DIAGNOSIS — H40013 Open angle with borderline findings, low risk, bilateral: Secondary | ICD-10-CM | POA: Diagnosis not present

## 2021-10-30 DIAGNOSIS — H04123 Dry eye syndrome of bilateral lacrimal glands: Secondary | ICD-10-CM | POA: Diagnosis not present

## 2021-11-05 DIAGNOSIS — N952 Postmenopausal atrophic vaginitis: Secondary | ICD-10-CM | POA: Diagnosis not present

## 2021-11-05 DIAGNOSIS — N949 Unspecified condition associated with female genital organs and menstrual cycle: Secondary | ICD-10-CM | POA: Diagnosis not present

## 2021-12-26 DIAGNOSIS — H02831 Dermatochalasis of right upper eyelid: Secondary | ICD-10-CM | POA: Diagnosis not present

## 2021-12-26 DIAGNOSIS — H02834 Dermatochalasis of left upper eyelid: Secondary | ICD-10-CM | POA: Diagnosis not present

## 2021-12-26 DIAGNOSIS — H16223 Keratoconjunctivitis sicca, not specified as Sjogren's, bilateral: Secondary | ICD-10-CM | POA: Diagnosis not present

## 2021-12-27 DIAGNOSIS — H02411 Mechanical ptosis of right eyelid: Secondary | ICD-10-CM | POA: Diagnosis not present

## 2021-12-27 DIAGNOSIS — H02831 Dermatochalasis of right upper eyelid: Secondary | ICD-10-CM | POA: Diagnosis not present

## 2021-12-27 DIAGNOSIS — H02834 Dermatochalasis of left upper eyelid: Secondary | ICD-10-CM | POA: Diagnosis not present

## 2021-12-27 DIAGNOSIS — H57813 Brow ptosis, bilateral: Secondary | ICD-10-CM | POA: Diagnosis not present

## 2022-02-18 DIAGNOSIS — M25562 Pain in left knee: Secondary | ICD-10-CM | POA: Diagnosis not present

## 2022-02-18 DIAGNOSIS — E785 Hyperlipidemia, unspecified: Secondary | ICD-10-CM | POA: Diagnosis not present

## 2022-02-18 DIAGNOSIS — J302 Other seasonal allergic rhinitis: Secondary | ICD-10-CM | POA: Diagnosis not present

## 2022-02-18 DIAGNOSIS — E039 Hypothyroidism, unspecified: Secondary | ICD-10-CM | POA: Diagnosis not present

## 2022-03-04 DIAGNOSIS — J3089 Other allergic rhinitis: Secondary | ICD-10-CM | POA: Diagnosis not present

## 2022-03-04 DIAGNOSIS — J4520 Mild intermittent asthma, uncomplicated: Secondary | ICD-10-CM | POA: Diagnosis not present

## 2022-03-04 DIAGNOSIS — L501 Idiopathic urticaria: Secondary | ICD-10-CM | POA: Diagnosis not present

## 2022-03-04 DIAGNOSIS — J301 Allergic rhinitis due to pollen: Secondary | ICD-10-CM | POA: Diagnosis not present

## 2022-05-07 DIAGNOSIS — H52203 Unspecified astigmatism, bilateral: Secondary | ICD-10-CM | POA: Diagnosis not present

## 2022-05-07 DIAGNOSIS — H40013 Open angle with borderline findings, low risk, bilateral: Secondary | ICD-10-CM | POA: Diagnosis not present

## 2022-05-07 DIAGNOSIS — H04123 Dry eye syndrome of bilateral lacrimal glands: Secondary | ICD-10-CM | POA: Diagnosis not present

## 2022-05-07 DIAGNOSIS — H5340 Unspecified visual field defects: Secondary | ICD-10-CM | POA: Diagnosis not present

## 2022-06-10 DIAGNOSIS — D485 Neoplasm of uncertain behavior of skin: Secondary | ICD-10-CM | POA: Diagnosis not present

## 2022-06-10 DIAGNOSIS — D1801 Hemangioma of skin and subcutaneous tissue: Secondary | ICD-10-CM | POA: Diagnosis not present

## 2022-06-10 DIAGNOSIS — L820 Inflamed seborrheic keratosis: Secondary | ICD-10-CM | POA: Diagnosis not present

## 2022-06-10 DIAGNOSIS — L821 Other seborrheic keratosis: Secondary | ICD-10-CM | POA: Diagnosis not present

## 2022-06-12 ENCOUNTER — Other Ambulatory Visit: Payer: Self-pay | Admitting: Internal Medicine

## 2022-06-12 DIAGNOSIS — Z1231 Encounter for screening mammogram for malignant neoplasm of breast: Secondary | ICD-10-CM

## 2022-07-09 DIAGNOSIS — Z23 Encounter for immunization: Secondary | ICD-10-CM | POA: Diagnosis not present

## 2022-07-24 DIAGNOSIS — Z23 Encounter for immunization: Secondary | ICD-10-CM | POA: Diagnosis not present

## 2022-07-29 ENCOUNTER — Ambulatory Visit
Admission: RE | Admit: 2022-07-29 | Discharge: 2022-07-29 | Disposition: A | Discharge status: Home / Self Care | Payer: Medicare Other | Source: Ambulatory Visit | Attending: Internal Medicine | Admitting: Internal Medicine

## 2022-07-29 DIAGNOSIS — Z1231 Encounter for screening mammogram for malignant neoplasm of breast: Secondary | ICD-10-CM

## 2022-08-08 DIAGNOSIS — H40023 Open angle with borderline findings, high risk, bilateral: Secondary | ICD-10-CM | POA: Diagnosis not present

## 2022-08-23 DIAGNOSIS — E785 Hyperlipidemia, unspecified: Secondary | ICD-10-CM | POA: Diagnosis not present

## 2022-08-23 DIAGNOSIS — M8580 Other specified disorders of bone density and structure, unspecified site: Secondary | ICD-10-CM | POA: Diagnosis not present

## 2022-08-23 DIAGNOSIS — R7989 Other specified abnormal findings of blood chemistry: Secondary | ICD-10-CM | POA: Diagnosis not present

## 2022-08-23 DIAGNOSIS — E039 Hypothyroidism, unspecified: Secondary | ICD-10-CM | POA: Diagnosis not present

## 2022-08-26 DIAGNOSIS — R82998 Other abnormal findings in urine: Secondary | ICD-10-CM | POA: Diagnosis not present

## 2022-08-26 DIAGNOSIS — R413 Other amnesia: Secondary | ICD-10-CM | POA: Diagnosis not present

## 2022-08-26 DIAGNOSIS — F419 Anxiety disorder, unspecified: Secondary | ICD-10-CM | POA: Diagnosis not present

## 2022-08-26 DIAGNOSIS — R2689 Other abnormalities of gait and mobility: Secondary | ICD-10-CM | POA: Diagnosis not present

## 2022-08-26 DIAGNOSIS — Z1339 Encounter for screening examination for other mental health and behavioral disorders: Secondary | ICD-10-CM | POA: Diagnosis not present

## 2022-08-26 DIAGNOSIS — Z0000 Encounter for general adult medical examination without abnormal findings: Secondary | ICD-10-CM | POA: Diagnosis not present

## 2022-08-26 DIAGNOSIS — Z1331 Encounter for screening for depression: Secondary | ICD-10-CM | POA: Diagnosis not present

## 2022-08-26 DIAGNOSIS — H547 Unspecified visual loss: Secondary | ICD-10-CM | POA: Diagnosis not present

## 2022-09-24 DIAGNOSIS — L821 Other seborrheic keratosis: Secondary | ICD-10-CM | POA: Diagnosis not present

## 2022-09-24 DIAGNOSIS — L738 Other specified follicular disorders: Secondary | ICD-10-CM | POA: Diagnosis not present

## 2022-09-24 DIAGNOSIS — L820 Inflamed seborrheic keratosis: Secondary | ICD-10-CM | POA: Diagnosis not present

## 2022-09-24 DIAGNOSIS — D1801 Hemangioma of skin and subcutaneous tissue: Secondary | ICD-10-CM | POA: Diagnosis not present

## 2022-11-18 DIAGNOSIS — H10413 Chronic giant papillary conjunctivitis, bilateral: Secondary | ICD-10-CM | POA: Diagnosis not present

## 2022-11-18 DIAGNOSIS — H16223 Keratoconjunctivitis sicca, not specified as Sjogren's, bilateral: Secondary | ICD-10-CM | POA: Diagnosis not present

## 2022-11-25 DIAGNOSIS — H02834 Dermatochalasis of left upper eyelid: Secondary | ICD-10-CM | POA: Diagnosis not present

## 2022-11-25 DIAGNOSIS — H02831 Dermatochalasis of right upper eyelid: Secondary | ICD-10-CM | POA: Diagnosis not present

## 2022-11-25 DIAGNOSIS — H16223 Keratoconjunctivitis sicca, not specified as Sjogren's, bilateral: Secondary | ICD-10-CM | POA: Diagnosis not present

## 2022-11-25 DIAGNOSIS — H10413 Chronic giant papillary conjunctivitis, bilateral: Secondary | ICD-10-CM | POA: Diagnosis not present

## 2022-12-25 DIAGNOSIS — H6123 Impacted cerumen, bilateral: Secondary | ICD-10-CM | POA: Diagnosis not present

## 2023-01-07 DIAGNOSIS — K219 Gastro-esophageal reflux disease without esophagitis: Secondary | ICD-10-CM | POA: Diagnosis not present

## 2023-01-07 DIAGNOSIS — J302 Other seasonal allergic rhinitis: Secondary | ICD-10-CM | POA: Diagnosis not present

## 2023-01-07 DIAGNOSIS — H9193 Unspecified hearing loss, bilateral: Secondary | ICD-10-CM | POA: Diagnosis not present

## 2023-01-07 DIAGNOSIS — R0989 Other specified symptoms and signs involving the circulatory and respiratory systems: Secondary | ICD-10-CM | POA: Diagnosis not present

## 2023-01-07 DIAGNOSIS — H6593 Unspecified nonsuppurative otitis media, bilateral: Secondary | ICD-10-CM | POA: Diagnosis not present

## 2023-02-24 DIAGNOSIS — E039 Hypothyroidism, unspecified: Secondary | ICD-10-CM | POA: Diagnosis not present

## 2023-02-24 DIAGNOSIS — K219 Gastro-esophageal reflux disease without esophagitis: Secondary | ICD-10-CM | POA: Diagnosis not present

## 2023-02-24 DIAGNOSIS — E785 Hyperlipidemia, unspecified: Secondary | ICD-10-CM | POA: Diagnosis not present

## 2023-06-17 ENCOUNTER — Other Ambulatory Visit: Payer: Self-pay | Admitting: Internal Medicine

## 2023-06-17 DIAGNOSIS — Z1231 Encounter for screening mammogram for malignant neoplasm of breast: Secondary | ICD-10-CM

## 2023-07-09 DIAGNOSIS — Z23 Encounter for immunization: Secondary | ICD-10-CM | POA: Diagnosis not present

## 2023-07-24 DIAGNOSIS — K219 Gastro-esophageal reflux disease without esophagitis: Secondary | ICD-10-CM | POA: Diagnosis not present

## 2023-07-24 DIAGNOSIS — Z0000 Encounter for general adult medical examination without abnormal findings: Secondary | ICD-10-CM | POA: Diagnosis not present

## 2023-07-24 DIAGNOSIS — M2012 Hallux valgus (acquired), left foot: Secondary | ICD-10-CM | POA: Diagnosis not present

## 2023-07-24 DIAGNOSIS — Z1389 Encounter for screening for other disorder: Secondary | ICD-10-CM | POA: Diagnosis not present

## 2023-07-24 DIAGNOSIS — E785 Hyperlipidemia, unspecified: Secondary | ICD-10-CM | POA: Diagnosis not present

## 2023-07-24 DIAGNOSIS — M79675 Pain in left toe(s): Secondary | ICD-10-CM | POA: Diagnosis not present

## 2023-07-24 DIAGNOSIS — R079 Chest pain, unspecified: Secondary | ICD-10-CM | POA: Diagnosis not present

## 2023-08-04 ENCOUNTER — Ambulatory Visit
Admission: RE | Admit: 2023-08-04 | Discharge: 2023-08-04 | Disposition: A | Discharge status: Home / Self Care | Payer: Medicare Other | Source: Ambulatory Visit | Attending: Internal Medicine | Admitting: Internal Medicine

## 2023-08-04 DIAGNOSIS — Z1231 Encounter for screening mammogram for malignant neoplasm of breast: Secondary | ICD-10-CM | POA: Diagnosis not present

## 2023-08-14 DIAGNOSIS — H524 Presbyopia: Secondary | ICD-10-CM | POA: Diagnosis not present

## 2023-08-14 DIAGNOSIS — H40013 Open angle with borderline findings, low risk, bilateral: Secondary | ICD-10-CM | POA: Diagnosis not present

## 2023-08-14 DIAGNOSIS — Z961 Presence of intraocular lens: Secondary | ICD-10-CM | POA: Diagnosis not present

## 2023-08-20 ENCOUNTER — Ambulatory Visit: Payer: Medicare Other | Admitting: Physician Assistant

## 2023-08-25 DIAGNOSIS — R7989 Other specified abnormal findings of blood chemistry: Secondary | ICD-10-CM | POA: Diagnosis not present

## 2023-08-25 DIAGNOSIS — E039 Hypothyroidism, unspecified: Secondary | ICD-10-CM | POA: Diagnosis not present

## 2023-08-25 DIAGNOSIS — E785 Hyperlipidemia, unspecified: Secondary | ICD-10-CM | POA: Diagnosis not present

## 2023-08-25 DIAGNOSIS — Z79899 Other long term (current) drug therapy: Secondary | ICD-10-CM | POA: Diagnosis not present

## 2023-08-26 NOTE — Progress Notes (Unsigned)
  Cardiology Office Note   Date:  08/28/2023  ID:  Leslie Willis, DOB Aug 06, 1941, MRN [id number] PCP:  Geoffry Paradise, MD [facility] Cardiologist: Chilton Si, MD  Reason for visit: Chest pain  History of Present Illness    Leslie Willis is a 82 y.o. female with a hx of hyperlipidemia, hypothyroidism, asthma.  She was initially seen by our office in 2018 with exertional arm discomfort.  She had a cardiac CTA 2019 with no CAD.  She was last seen by Chilton Si in October 2022.  She noted when climbing up an incline, she developed central chest pain radiating to her left side with associated shortness of breath.  Resting helps resolve her symptoms.  Exercise treadmill was ordered & was negative.  Dr. Duke Salvia commented symptoms could be related to GERD or musculoskeletal.  Today, patient comes in with her chronic symptoms.  She states when she walks fast, she feels a central chest pressure with associated left arm numbness.  She will stop and then slow down.  She feels like this is now occurring every time she walks.  Symptoms have been ongoing for the last 5 years.  She does not feel like this is related to asthma, no wheezing.  She denies associated shortness of breath, nausea, sweating.  No palpitations, significant lightheadedness, syncope or lower extremity edema.  She states no prior diagnosis to explain her symptoms.  She has not tried any medical therapy for it.  Patient mentions that her extremities do get cold and numb in lower temperatures.   Objective / Physical Exam   Vital signs:  BP 110/62 (BP Location: Left Arm, Patient Position: Sitting, Cuff Size: Normal)   Pulse 66   Ht 5\' 3"  (1.6 m)   Wt 138 lb (62.6 kg)   SpO2 97%   BMI 24.45 kg/m     GEN: No acute distress NECK: No carotid bruits CARDIAC: RRR, no murmurs RESPIRATORY:  Clear to auscultation without rales, wheezing or rhonchi  EXTREMITIES: No edema  Assessment and Plan   Prinzmetal  variant angina -Normal CTA coronaries in 2019 -Negative ETT in 08/2021 -Echo 2019 with normal EF, grade 2 diastolic dysfunction, mild TR -Will trial amlodipine 2.5 mg daily to see if this helps her symptoms.  Advised her to let us know if blood pressure drops or if she experiences lightheadedness. -Will defer on further testing at this time. -Follow-up with Dr. Duke Salvia as scheduled in March.  Hyperlipidemia with goal LDL less than 100 -LDL 93 in November 2024.  Continue Zocor. -Recommend cholesterol lowering diets - Mediterranean diet, DASH diet, vegetarian diet, low-carbohydrate diet and avoidance of trans fats.  Discussed healthier choice substitutes.  Nuts, high-fiber foods, and fiber supplements may also improve lipids.    Disposition - Follow-up in March with Dr. Duke Salvia as scheduled.   Signed, Cannon Kettle, PA-C  08/28/2023 [facility]

## 2023-08-28 ENCOUNTER — Encounter: Payer: Self-pay | Admitting: Physician Assistant

## 2023-08-28 ENCOUNTER — Ambulatory Visit: Payer: Medicare Other | Attending: Physician Assistant | Admitting: Physician Assistant

## 2023-08-28 VITALS — BP 110/62 | HR 66 | Ht 63.0 in | Wt 138.0 lb

## 2023-08-28 DIAGNOSIS — R0789 Other chest pain: Secondary | ICD-10-CM | POA: Insufficient documentation

## 2023-08-28 DIAGNOSIS — E7800 Pure hypercholesterolemia, unspecified: Secondary | ICD-10-CM | POA: Diagnosis not present

## 2023-08-28 DIAGNOSIS — I201 Angina pectoris with documented spasm: Secondary | ICD-10-CM | POA: Diagnosis not present

## 2023-08-28 MED ORDER — AMLODIPINE BESYLATE 2.5 MG PO TABS: 2.5000 mg | ORAL_TABLET | Freq: Every day | ORAL | 3 refills | Status: DC

## 2023-08-28 NOTE — Patient Instructions (Addendum)
Medication Instructions:  Start Amlodipine 2.5 mg  *If you need a refill on your cardiac medications before your next appointment, please call your pharmacy*   Lab Work: No Labs If you have labs (blood work) drawn today and your tests are completely normal, you will receive your results only by: MyChart Message (if you have MyChart) OR A paper copy in the mail If you have any lab test that is abnormal or we need to change your treatment, we will call you to review the results.   Testing/Procedures: No Testing   Follow-Up: At [facility], you and your health needs are our priority.  As part of our continuing mission to provide you with exceptional heart care, we have created designated Provider Care Teams.  These Care Teams include your primary Cardiologist (physician) and Advanced Practice Providers (APPs -  Physician Assistants and Nurse Practitioners) who all work together to provide you with the care you need, when you need it.  We recommend signing up for the patient portal called "MyChart".  Sign up information is provided on this After Visit Summary.  MyChart is used to connect with patients for Virtual Visits (Telemedicine).  Patients are able to view lab/test results, encounter notes, upcoming appointments, etc.  Non-urgent messages can be sent to your provider as well.   To learn more about what you can do with MyChart, go to [email or web address].    Your next appointment:   Keep Scheduled Appointment  Provider:   Chilton Si, MD

## 2023-09-01 DIAGNOSIS — Z0000 Encounter for general adult medical examination without abnormal findings: Secondary | ICD-10-CM | POA: Diagnosis not present

## 2023-09-01 DIAGNOSIS — M8580 Other specified disorders of bone density and structure, unspecified site: Secondary | ICD-10-CM | POA: Diagnosis not present

## 2023-09-01 DIAGNOSIS — E785 Hyperlipidemia, unspecified: Secondary | ICD-10-CM | POA: Diagnosis not present

## 2023-09-01 DIAGNOSIS — Z1339 Encounter for screening examination for other mental health and behavioral disorders: Secondary | ICD-10-CM | POA: Diagnosis not present

## 2023-09-01 DIAGNOSIS — E663 Overweight: Secondary | ICD-10-CM | POA: Diagnosis not present

## 2023-09-01 DIAGNOSIS — R82998 Other abnormal findings in urine: Secondary | ICD-10-CM | POA: Diagnosis not present

## 2023-09-01 DIAGNOSIS — K219 Gastro-esophageal reflux disease without esophagitis: Secondary | ICD-10-CM | POA: Diagnosis not present

## 2023-09-01 DIAGNOSIS — Z1331 Encounter for screening for depression: Secondary | ICD-10-CM | POA: Diagnosis not present

## 2023-09-01 DIAGNOSIS — R079 Chest pain, unspecified: Secondary | ICD-10-CM | POA: Diagnosis not present

## 2023-09-01 DIAGNOSIS — E039 Hypothyroidism, unspecified: Secondary | ICD-10-CM | POA: Diagnosis not present

## 2023-12-15 ENCOUNTER — Ambulatory Visit (HOSPITAL_BASED_OUTPATIENT_CLINIC_OR_DEPARTMENT_OTHER): Payer: Medicare Other | Admitting: Cardiovascular Disease

## 2024-01-08 DIAGNOSIS — H04123 Dry eye syndrome of bilateral lacrimal glands: Secondary | ICD-10-CM | POA: Diagnosis not present

## 2024-03-15 DIAGNOSIS — K219 Gastro-esophageal reflux disease without esophagitis: Secondary | ICD-10-CM | POA: Diagnosis not present

## 2024-03-15 DIAGNOSIS — R0789 Other chest pain: Secondary | ICD-10-CM | POA: Diagnosis not present

## 2024-03-15 DIAGNOSIS — E785 Hyperlipidemia, unspecified: Secondary | ICD-10-CM | POA: Diagnosis not present

## 2024-03-15 DIAGNOSIS — E039 Hypothyroidism, unspecified: Secondary | ICD-10-CM | POA: Diagnosis not present

## 2024-03-19 ENCOUNTER — Telehealth: Payer: Self-pay | Admitting: Cardiovascular Disease

## 2024-03-19 NOTE — Telephone Encounter (Signed)
 Spoke with patient, symptoms no different that has been in past just coming more frequently  She is comfortable waiting until 6/16 for appointment  Reviewed ED precautions   Tried to call [facility] to make aware, phones turned off   Will fax to Dr Delorise Few so he will be aware

## 2024-03-19 NOTE — Telephone Encounter (Signed)
 Returned call to Mariah Shines at [employer]- no answer, left message- need information on why patient needs to be seen so urgently.

## 2024-03-19 NOTE — Telephone Encounter (Signed)
 Leslie Willis calling back stating 6/16 works. She states Dr. Delorise Few said she has long-standing exertional CP with left arm numbness. She states he wants pt evaluated for possible cath.

## 2024-03-19 NOTE — Telephone Encounter (Signed)
 Leslie Willis with [facility] called in stating Dr. Delorise Few wants pt seen by only Dr. Theodis Fiscal in a week. He does not want pt seen by APP. Please advise.

## 2024-03-25 ENCOUNTER — Encounter (HOSPITAL_BASED_OUTPATIENT_CLINIC_OR_DEPARTMENT_OTHER): Payer: Self-pay

## 2024-03-25 DIAGNOSIS — H04123 Dry eye syndrome of bilateral lacrimal glands: Secondary | ICD-10-CM | POA: Diagnosis not present

## 2024-03-25 DIAGNOSIS — H26493 Other secondary cataract, bilateral: Secondary | ICD-10-CM | POA: Diagnosis not present

## 2024-03-29 ENCOUNTER — Other Ambulatory Visit (HOSPITAL_BASED_OUTPATIENT_CLINIC_OR_DEPARTMENT_OTHER)

## 2024-03-29 ENCOUNTER — Encounter (HOSPITAL_BASED_OUTPATIENT_CLINIC_OR_DEPARTMENT_OTHER): Payer: Self-pay | Admitting: Cardiovascular Disease

## 2024-03-29 ENCOUNTER — Other Ambulatory Visit (HOSPITAL_COMMUNITY): Payer: Self-pay

## 2024-03-29 ENCOUNTER — Ambulatory Visit (INDEPENDENT_AMBULATORY_CARE_PROVIDER_SITE_OTHER): Admitting: Cardiovascular Disease

## 2024-03-29 VITALS — BP 100/62 | HR 72 | Ht 63.0 in | Wt 139.8 lb

## 2024-03-29 DIAGNOSIS — R002 Palpitations: Secondary | ICD-10-CM

## 2024-03-29 DIAGNOSIS — E7800 Pure hypercholesterolemia, unspecified: Secondary | ICD-10-CM | POA: Diagnosis not present

## 2024-03-29 DIAGNOSIS — I5189 Other ill-defined heart diseases: Secondary | ICD-10-CM | POA: Diagnosis not present

## 2024-03-29 DIAGNOSIS — R072 Precordial pain: Secondary | ICD-10-CM

## 2024-03-29 LAB — BASIC METABOLIC PANEL WITH GFR
BUN/Creatinine Ratio: 22 (ref 12–28)
BUN: 19 mg/dL (ref 8–27)
CO2: 21 mmol/L (ref 20–29)
Calcium: 9.3 mg/dL (ref 8.7–10.3)
Chloride: 106 mmol/L (ref 96–106)
Creatinine, Ser: 0.85 mg/dL (ref 0.57–1.00)
Glucose: 79 mg/dL (ref 70–99)
Potassium: 4.3 mmol/L (ref 3.5–5.2)
Sodium: 144 mmol/L (ref 134–144)
eGFR: 68 mL/min/{1.73_m2} (ref >59)

## 2024-03-29 MED ORDER — IVABRADINE HCL 5 MG PO TABS
10.0000 mg | ORAL_TABLET | Freq: Once | ORAL | 0 refills | Status: AC
  Filled 2024-03-29: qty 2, 1d supply, fill #0

## 2024-03-29 NOTE — Progress Notes (Signed)
 Cardiology Office Note:  .   Date:  03/29/2024  ID:  Leslie Willis, DOB 1940/12/15, MRN [id number] PCP: Suan Elm, MD  [facility] Providers Cardiologist:  Maudine Sos, MD    History of Present Illness: .    Leslie Willis is a 83 y.o. female with hypertension, hyperlipidemia, hypothyroidism and GERD here for follow-up.  She was initially seen 05/2017 for the evaluation of exertional arm discomfort.  Ms. Lindo has noted that she sometimes gets cramps in her arms when walking up and down hills.  She does not have it when walking on flat land.  She was referred for an echocardiogram 05/2018 that revealed LVEF 60 to 65% with grade 2 diastolic dysfunction.  There is no evidence of volume overload.  She was also referred for cardiac CT-A 06/2018 that revealed no CAD. She saw Humphrey Magnuson, PA-C virtually 01/2020 and was doing well.    At her visit 07/2021 she reported some exertional chest pain and dyspnea.  She had a treadmill stress test 08/2021 where she achieved 6.3 METS and was negative for ischemia.  Discussed the use of AI scribe software for clinical note transcription with the patient, who gave verbal consent to proceed.  History of Present Illness Ms. Lockridge experiences chest pain and numbness in her left arm during exertion, such as walking to her car or around her neighborhood. The pain begins in her chest and extends to her left arm, accompanied by a funny feeling and numbness. These symptoms occur after walking about half a block at a normal pace but not when walking slowly. The symptoms do not occur at rest.  She experiences a daily fluttering sensation in her chest upon waking, which she feels in her ear but does not hear. This sensation lasts briefly and does not occur during the day. No shortness of breath during these episodes, but she mentions occasional shortness of breath unrelated to the fluttering, which she attributes to her asthma and allergies to cats and  cigarette smoke.  She reports minor swelling in her feet at night, accompanied by a sensation of heat from her waist down. This swelling is not present during the day. She has been on a low dose of amlodipine  since her last visit, which helped with cold sensitivity.  She has no orthopnea or PND.   She mentions occasional cramps starting in her back that double her over and then gradually subside. She has a history of reflux and experiences throat irritation and coughing.  She recalls her cholesterol levels being excellent during her last lab work in November.   ROS:  As per HPI  Studies Reviewed: Aaron Aas        EKG Interpretation Date/Time:    Ventricular Rate:    PR Interval:    QRS Duration:    QT Interval:    QTC Calculation:   R Axis:      Text Interpretation:           Risk Assessment/Calculations:             Physical Exam:   VS:  BP 100/62   Pulse 72   Ht 5' 3 (1.6 m)   Wt 139 lb 12.8 oz (63.4 kg)   SpO2 98%   BMI 24.76 kg/m  , BMI Body mass index is 24.76 kg/m. GENERAL:  Well appearing HEENT: Pupils equal round and reactive, fundi not visualized, oral mucosa unremarkable NECK:  No jugular venous distention, waveform within normal limits, carotid upstroke  brisk and symmetric, no bruits, no thyromegaly LUNGS:  Clear to auscultation bilaterally HEART:  RRR.  PMI not displaced or sustained,S1 and S2 within normal limits, no S3, no S4, no clicks, no rubs, no murmurs ABD:  Flat, positive bowel sounds normal in frequency in pitch, no bruits, no rebound, no guarding, no midline pulsatile mass, no hepatomegaly, no splenomegaly EXT:  2 plus pulses throughout, no edema, no cyanosis no clubbing SKIN:  No rashes no nodules NEURO:  Cranial nerves II through XII grossly intact, motor grossly intact throughout PSYCH:  Cognitively intact, oriented to person place and time   ASSESSMENT AND PLAN: .    Assessment & Plan # Chest pain and arm numbness Intermittent chest pain and  left arm numbness with exertion suggest possible cardiac etiology. Major blockage less likely due to controlled risk factors and normal coronary arteries on CT-A in 2019. Differential includes obstructive CAD, thoracic spine radicular pain, and GERD. Cardiac CT preferred for sensitivity in detecting blockages. - Order cardiac CT-A.  Given her hypotension, hold amlodipine  and give ivabradine 10mg . - Schedule echocardiogram.  # Palpitations Daily palpitations described as fluttering sensation, primarily in the morning. Heart monitor will help correlate sensations with heart activity.  CMP, thyroid , and electrolytes wnl with PCP. - Place 3-day ZIO heart monitor.  # Shortness of breath Intermittent dyspnea, sometimes at rest. Per patient her asthma and allergies may contribute.  Echo as above.   #Swelling of feet Minor nocturnal pedal edema, possibly a side effect of amlodipine . Prefers to continue medication due to cold sensitivity.  She has no edema on exam today.   # Gastroesophageal reflux disease (GERD) GERD with throat irritation and coughing, possibly contributing to chest symptoms, especially postprandial or when supine.         Dispo: f/u 6 weeks with APP.  Signed, Maudine Sos, MD

## 2024-03-29 NOTE — Patient Instructions (Addendum)
 Medication Instructions:  TAKE Ivabradine 5 MG 2 TABLETS 2 HOURS PRIOR TO CT  DO NOT TAKE YOUR AMLODIPINE  DAY OF CT   *If you need a refill on your cardiac medications before your next appointment, please call your pharmacy*  Lab Work: BMET TODAY   If you have labs (blood work) drawn today and your tests are completely normal, you will receive your results only by: MyChart Message (if you have MyChart) OR A paper copy in the mail If you have any lab test that is abnormal or we need to change your treatment, we will call you to review the results.  Testing/Procedures: Your physician has requested that you have cardiac CT. Cardiac computed tomography (CT) is a painless test that uses an x-ray machine to take clear, detailed pictures of your heart. For further information please visit [email or web address] Please follow instruction sheet as given.  Your physician has requested that you have an echocardiogram. Echocardiography is a painless test that uses sound waves to create images of your heart. It provides your doctor with information about the size and shape of your heart and how well your heart's chambers and valves are working. This procedure takes approximately one hour. There are no restrictions for this procedure. Please do NOT wear cologne, perfume, aftershave, or lotions (deodorant is allowed). Please arrive 15 minutes prior to your appointment time.  Please note: We ask at that you not bring children with you during ultrasound (echo/ vascular) testing. Due to room size and safety concerns, children are not allowed in the ultrasound rooms during exams. Our front office staff cannot provide observation of children in our lobby area while testing is being conducted. An adult accompanying a patient to their appointment will only be allowed in the ultrasound room at the discretion of the ultrasound technician under special circumstances. We apologize for any inconvenience.  3 DAY ZIO    Follow-Up: At [facility], you and your health needs are our priority.  As part of our continuing mission to provide you with exceptional heart care, our providers are all part of one team.  This team includes your primary Cardiologist (physician) and Advanced Practice Providers or APPs (Physician Assistants and Nurse Practitioners) who all work together to provide you with the care you need, when you need it.  Your next appointment:   6 TO 8 week(s)  Provider:   Maudine Sos, MD, Slater Duncan, NP, or Neomi Banks, NP    We recommend signing up for the patient portal called MyChart.  Sign up information is provided on this After Visit Summary.  MyChart is used to connect with patients for Virtual Visits (Telemedicine).  Patients are able to view lab/test results, encounter notes, upcoming appointments, etc.  Non-urgent messages can be sent to your provider as well.   To learn more about what you can do with MyChart, go to [email or web address].   Other Instructions   Your cardiac CT will be scheduled at one of the below locations:   [facility] [address] [place], [place] [address] [phone]  OR  [facility] [address] [place], [place] [address] [phone]  OR   [facility] [address] [place], [place] [address] [phone]  OR   [facility] [address] [place], [place] [address] [phone]  OR   [facility] and V[facility] Tower [address]  [place], [place] [address] Opening February 09, 2024  If scheduled at [facility], please arrive at the [facility] and Children's Entrance (Entrance C2) of [facility] 30 minutes prior to test start time. You can use the FREE valet parking offered at entrance C (encouraged to control the heart rate for the test)  Proceed to the [facility] Radiology Department  (first floor) to check-in and test prep.   All radiology patients and guests should use entrance C2 at [facility], accessed from [facility], even though the hospital's physical address listed is [address]    If scheduled at the Heart and Vascular Tower at [address], please enter the parking lot using the [address] entrance and use the FREE valet service at the patient drop-off area. Enter the buidling and check-in with registration on the main floor.  If scheduled at [facility] or [facility], please arrive 15 mins early for check-in and test prep.  There is spacious parking and easy access to the radiology department from the [facility] Heart and Vascular entrance. Please enter here and check-in with the desk attendant.   If scheduled at [facility], please arrive 30 minutes early for check-in and test prep.  Please follow these instructions carefully (unless otherwise directed):  An IV will be required for this test and Nitroglycerin  will be given.  Hold all erectile dysfunction medications at least 3 days (72 hrs) prior to test. (Ie viagra, cialis, sildenafil, tadalafil, etc)   On the Night Before the Test: Be sure to Drink plenty of water. Do not consume any caffeinated/decaffeinated beverages or chocolate 12 hours prior to your test. Do not take any antihistamines 12 hours prior to your test.  On the Day of the Test: Drink plenty of water until 1 hour prior to the test. Do not eat any food 1 hour prior to test. You may take your regular medications prior to the test.  Take Ivabradine  two hours prior to test, do not take Amlodipine  day of CT If you take Furosemide/Hydrochlorothiazide/Spironolactone/Chlorthalidone, please HOLD on the morning of the test. Patients who wear a continuous glucose monitor MUST remove the device prior to scanning. FEMALES- please wear underwire-free bra if  available, avoid dresses & tight clothing      After the Test: Drink plenty of water. After receiving IV contrast, you may experience a mild flushed feeling. This is normal. On occasion, you may experience a mild rash up to 24 hours after the test. This is not dangerous. If this occurs, you can take Benadryl 25 mg, Zyrtec, Claritin, or Allegra and increase your fluid intake. (Patients taking Tikosyn should avoid Benadryl, and may take Zyrtec, Claritin, or Allegra) If you experience trouble breathing, this can be serious. If it is severe call 911 IMMEDIATELY. If it is mild, please call our office.  We will call to schedule your test 2-4 weeks out understanding that some insurance companies will need an authorization prior to the service being performed.   For more information and frequently asked questions, please visit our website : [email or web address]  For non-scheduling related questions, please contact the cardiac imaging nurse navigator should you have any questions/concerns: Cardiac Imaging Nurse Navigators Direct Office Dial: [phone]   For scheduling needs, including cancellations and rescheduling, please call [place], [phone].  Cardiac CT Angiogram A cardiac CT angiogram is a procedure to look at the heart and the area around the heart. It may be done to help find the cause of chest pains  or other symptoms of heart disease. During this procedure, a substance called contrast dye is injected into a vein in the arm. The contrast highlights the blood vessels in the area to be checked. A large X-ray machine (CT scanner), then takes detailed pictures of the heart and the surrounding area. The procedure is also sometimes called a coronary CT angiogram, coronary artery scanning, or CTA. A cardiac CT angiogram allows the health care provider to see how well blood is flowing to and from the heart. The provider will be able to see if there are any problems, such as: Blockage  or narrowing of the arteries in the heart. Fluid around the heart. Signs of weakness or disease in the muscles, valves, and tissues of the heart. Tell a health care provider about: Any allergies you have. This is especially important if you have had a previous allergic reaction to medicines, contrast dye, or iodine. All medicines you are taking, including vitamins, herbs, eye drops, creams, and over-the-counter medicines. Any bleeding problems you have. Any surgeries you have had. Any medical conditions you have, including kidney problems or kidney failure. Whether you are pregnant or may be pregnant. Any anxiety disorders, chronic pain, or other conditions you have. These may increase your stress or prevent you from lying still. Any history of abnormal heart rhythms or heart procedures. What are the risks? Your provider will talk with you about risks. These may include: Bleeding. Infection. Allergic reactions to medicines or dyes. Damage to other structures or organs. Kidney damage from the contrast dye. Increased risk of cancer from radiation exposure. This risk is low. Talk with your provider about: The risks and benefits of testing. How you can receive the lowest dose of radiation. What happens before the procedure? Wear comfortable clothing and remove any jewelry, glasses, dentures, and hearing aids. Follow instructions from your provider about eating and drinking. These may include: 12 hours before the procedure Avoid caffeine. This includes tea, coffee, soda, energy drinks, and diet pills. Drink plenty of water or other fluids that do not have caffeine in them. Being well hydrated can prevent complications. 4-6 hours before the procedure Stop eating and drinking. This will reduce the risk of nausea from the contrast dye. Ask your provider about changing or stopping your regular medicines. These include: Diabetes medicines. Medicines to treat problems with erections (erectile  dysfunction). If you have kidney problems, you may need to receive IV hydration before and after the test. What happens during the procedure?  Hair on your chest may need to be removed so that small sticky patches called electrodes can be placed on your chest. These will transmit information that helps to monitor your heart during the procedure. An IV will be inserted into one of your veins. You might be given a medicine to control your heart rate during the procedure. This will help to ensure that good images are obtained. You will be asked to lie on an exam table. This table will slide in and out of the CT machine during the procedure. Contrast dye will be injected into the IV. You might feel warm, or you may get a metallic taste in your mouth. You may be given medicines to relax or dilate the arteries in your heart. If you are allergic to contrast dyes or iodine you may be given medicine before the test to reduce the risk of an allergic reaction. The table that you are lying on will move into the CT machine tunnel for the scan. The  person running the machine will give you instructions while the scans are being done. You may be asked to: Keep your arms above your head. Hold your breath for short periods. Stay very still, even if the table is moving. The procedure may vary among providers and hospitals. What can I expect after the procedure? After your procedure, it is common to have: A metallic taste in your mouth from the contrast dye. A feeling of warmth. A headache from the heart medicine. Follow these instructions at home: Take over-the-counter and prescription medicines only as told by your provider. If you are told, drink enough fluid to keep your pee pale yellow. This will help to flush the contrast dye out of your body. Most people can return to their normal activities right after the procedure. Ask your provider what activities are safe for you. It is up to you to get the results  of your procedure. Ask your provider, or the department that is doing the procedure, when your results will be ready. Contact a health care provider if: You have any symptoms of allergy to the contrast dye. These include: Shortness of breath. Rash or hives. A racing heartbeat. You notice a change in your peeing (urination). This information is not intended to replace advice given to you by your health care provider. Make sure you discuss any questions you have with your health care provider. Document Revised: 05/03/2022 Document Reviewed: 05/03/2022 Elsevier Patient Education  2024 Elsevier Inc.  Delane Fear- Long Term Monitor Instructions  Your physician has requested you wear a ZIO patch monitor for 14 days.  This is a single patch monitor. [employer] supplies one patch monitor per enrollment. Additional stickers are not available. Please do not apply patch if you will be having a Nuclear Stress Test,  Echocardiogram, Cardiac CT, MRI, or Chest Xray during the period you would be wearing the  monitor. The patch cannot be worn during these tests. You cannot remove and re-apply the  ZIO XT patch monitor.  Your ZIO patch monitor will be mailed 3 day USPS to your address on file. It may take 3-5 days  to receive your monitor after you have been enrolled.  Once you have received your monitor, please review the enclosed instructions. Your monitor  has already been registered assigning a specific monitor serial # to you.  Billing and Patient Assistance Program Information  We have supplied [employer] with any of your insurance information on file for billing purposes. [employer] offers a sliding scale Patient Assistance Program for patients that do not have  insurance, or whose insurance does not completely cover the cost of the ZIO monitor.  You must apply for the Patient Assistance Program to qualify for this discounted rate.  To apply, please call [employer] at [phone], select option 4, select option  2, ask to apply for  Patient Assistance Program. Sanna Crystal will ask your household income, and how many people  are in your household. They will quote your out-of-pocket cost based on that information.  [employer] will also be able to set up a 6-month, interest-free payment plan if needed.  Applying the monitor   Shave hair from upper left chest.  Hold abrader disc by orange tab. Rub abrader in 40 strokes over the upper left chest as  indicated in your monitor instructions.  Clean area with 4 enclosed alcohol  pads. Let dry.  Apply patch as indicated in monitor instructions. Patch will be placed under collarbone on left  side of chest with arrow  pointing upward.  Rub patch adhesive wings for 2 minutes. Remove white label marked 1. Remove the white  label marked 2. Rub patch adhesive wings for 2 additional minutes.  While looking in a mirror, press and release button in center of patch. A small green light will  flash 3-4 times. This will be your only indicator that the monitor has been turned on.  Do not shower for the first 24 hours. You may shower after the first 24 hours.  Press the button if you feel a symptom. You will hear a small click. Record Date, Time and  Symptom in the Patient Logbook.  When you are ready to remove the patch, follow instructions on the last 2 pages of Patient  Logbook. Stick patch monitor onto the last page of Patient Logbook.  Place Patient Logbook in the blue and white box. Use locking tab on box and tape box closed  securely. The blue and white box has prepaid postage on it. Please place it in the mailbox as  soon as possible. Your physician should have your test results approximately 7 days after the  monitor has been mailed back to [facility].  Call [facility] Customer Care at [phone] if you have questions regarding  your ZIO XT patch monitor. Call them immediately if you see an orange light blinking on your  monitor.  If your monitor falls  off in less than 4 days, contact our Monitor department at [phone].  If your monitor becomes loose or falls off after 4 days call [employer] at [phone] for  suggestions on securing your monitor

## 2024-04-02 ENCOUNTER — Ambulatory Visit: Payer: Self-pay | Admitting: Cardiovascular Disease

## 2024-04-02 DIAGNOSIS — R002 Palpitations: Secondary | ICD-10-CM

## 2024-04-02 DIAGNOSIS — R072 Precordial pain: Secondary | ICD-10-CM

## 2024-04-05 ENCOUNTER — Encounter (HOSPITAL_COMMUNITY): Payer: Self-pay

## 2024-04-05 ENCOUNTER — Telehealth: Payer: Self-pay | Admitting: Cardiovascular Disease

## 2024-04-05 ENCOUNTER — Other Ambulatory Visit (HOSPITAL_COMMUNITY): Payer: Self-pay

## 2024-04-05 NOTE — Telephone Encounter (Signed)
 Returned call to pt- pt thought she needed to also take prednisone/ benadryl prior to the scan- clarified this is only if you have contrast allergy. Patient verbalizes understanding and will take Ivabradine as prescribed.

## 2024-04-05 NOTE — Telephone Encounter (Signed)
 Pt c/o medication issue:  1. Name of Medication: ivabradine (CORLANOR) 5 MG TABS tablet   2. How are you currently taking this medication (dosage and times per day)? As written  3. Are you having a reaction (difficulty breathing--STAT)? No   4. What is your medication issue? Pt states she was told she would be taking a different medication however the pharmacy gave her this refill instead.

## 2024-04-07 ENCOUNTER — Ambulatory Visit (HOSPITAL_COMMUNITY)
Admission: RE | Admit: 2024-04-07 | Discharge: 2024-04-07 | Disposition: A | Discharge status: Home / Self Care | Source: Ambulatory Visit | Attending: Cardiovascular Disease | Admitting: Cardiovascular Disease

## 2024-04-07 DIAGNOSIS — R072 Precordial pain: Secondary | ICD-10-CM | POA: Insufficient documentation

## 2024-04-07 DIAGNOSIS — R002 Palpitations: Secondary | ICD-10-CM | POA: Insufficient documentation

## 2024-04-07 DIAGNOSIS — E7800 Pure hypercholesterolemia, unspecified: Secondary | ICD-10-CM | POA: Insufficient documentation

## 2024-04-07 MED ORDER — NITROGLYCERIN 0.4 MG SL SUBL
0.8000 mg | SUBLINGUAL_TABLET | Freq: Once | SUBLINGUAL | Status: AC
  Administered 2024-04-07: 0.8 mg via SUBLINGUAL

## 2024-04-07 MED ORDER — IOHEXOL 350 MG/ML SOLN
100.0000 mL | Freq: Once | INTRAVENOUS | Status: AC | PRN
  Administered 2024-04-07: 100 mL via INTRAVENOUS

## 2024-04-08 ENCOUNTER — Telehealth (HOSPITAL_COMMUNITY): Payer: Self-pay | Admitting: *Deleted

## 2024-04-08 NOTE — Progress Notes (Signed)
 Pt had contrast extravasation during coronary CTA resulting in 40cc contrast/saline in her L antecubital space. No apparent swelling but patient did report pain to the site.   Assess patients pulses and sensation distal which were intact. Pt was able move fingers and wrist with normal range of motion. Denied tingling or numbness to the hand/fingers. Bleeding was controlled at the time the IV was removed. IV catheter was intact upon removal.   Educated patient that the contrast/saline in her arm would absorb over time and there may be bruising over the next few days. Advised her to keep the arm elevated and apply ice to reduce swelling. Pt also informed of return precautions such as if theres new tingling/numbness to the fingers, increased swelling, bleeding, or inability to move distal to the injury.   Camie Shutter RN Navigator Cardiac Imaging [facility] [phone] Office  [phone] Cell

## 2024-04-08 NOTE — Telephone Encounter (Signed)
 Calling patient to discuss her CCTA study from yesterday. I asked about the extravasation site on her arm. She reports that the swelling has improved. I informed that the CCTA was non diagnostic and asked her if she would be will to return. She hesitant at this time but would like to speak with Dr. Raford. I messaged Dr. Raford and made her aware.  Chantal Requena RN Navigator Cardiac Imaging [facility] [phone] Office [phone] Cell

## 2024-04-13 DIAGNOSIS — R002 Palpitations: Secondary | ICD-10-CM | POA: Diagnosis not present

## 2024-04-13 DIAGNOSIS — R072 Precordial pain: Secondary | ICD-10-CM | POA: Diagnosis not present

## 2024-04-20 ENCOUNTER — Other Ambulatory Visit: Payer: Self-pay | Admitting: Cardiovascular Disease

## 2024-04-20 DIAGNOSIS — R002 Palpitations: Secondary | ICD-10-CM

## 2024-04-20 DIAGNOSIS — R072 Precordial pain: Secondary | ICD-10-CM

## 2024-04-21 ENCOUNTER — Encounter (HOSPITAL_COMMUNITY): Payer: Self-pay | Admitting: *Deleted

## 2024-04-26 DIAGNOSIS — H26492 Other secondary cataract, left eye: Secondary | ICD-10-CM | POA: Diagnosis not present

## 2024-04-27 ENCOUNTER — Ambulatory Visit (HOSPITAL_BASED_OUTPATIENT_CLINIC_OR_DEPARTMENT_OTHER)

## 2024-04-27 DIAGNOSIS — R072 Precordial pain: Secondary | ICD-10-CM

## 2024-04-27 DIAGNOSIS — R002 Palpitations: Secondary | ICD-10-CM | POA: Diagnosis not present

## 2024-04-27 LAB — ECHOCARDIOGRAM COMPLETE
Area-P 1/2: 3.27 cm2
S' Lateral: 2.01 cm

## 2024-04-28 ENCOUNTER — Ambulatory Visit (HOSPITAL_COMMUNITY)
Admission: RE | Admit: 2024-04-28 | Discharge: 2024-04-28 | Disposition: A | Discharge status: Home / Self Care | Source: Ambulatory Visit | Attending: Cardiovascular Disease | Admitting: Cardiovascular Disease

## 2024-04-28 DIAGNOSIS — R002 Palpitations: Secondary | ICD-10-CM

## 2024-04-28 DIAGNOSIS — R072 Precordial pain: Secondary | ICD-10-CM | POA: Insufficient documentation

## 2024-04-28 LAB — MYOCARDIAL PERFUSION IMAGING
Base ST Depression (mm): 0 mm
LV dias vol: 49 mL (ref 46–106)
LV sys vol: 3 mL (ref 3.8–5.2)
Nuc Stress EF: 94 %
Peak HR: 114 {beats}/min
Rest HR: 55 {beats}/min
Rest Nuclear Isotope Dose: 10.7 mCi
SDS: 0
SRS: 3
SSS: 1
ST Elevation (mm): 0.5 mm
TID: 1.12

## 2024-04-28 MED ORDER — REGADENOSON 0.4 MG/5ML IV SOLN
INTRAVENOUS | Status: AC
  Filled 2024-04-28: qty 5

## 2024-04-28 MED ORDER — REGADENOSON 0.4 MG/5ML IV SOLN
0.4000 mg | Freq: Once | INTRAVENOUS | Status: AC
  Administered 2024-04-28: 0.4 mg via INTRAVENOUS

## 2024-04-28 MED ORDER — TECHNETIUM TC 99M TETROFOSMIN IV KIT
31.3000 | PACK | Freq: Once | INTRAVENOUS | Status: AC | PRN
  Administered 2024-04-28: 31.3 via INTRAVENOUS

## 2024-04-28 MED ORDER — TECHNETIUM TC 99M TETROFOSMIN IV KIT
10.7000 | PACK | Freq: Once | INTRAVENOUS | Status: AC | PRN
  Administered 2024-04-28: 10.7 via INTRAVENOUS

## 2024-05-02 ENCOUNTER — Ambulatory Visit: Payer: Self-pay | Admitting: Cardiovascular Disease

## 2024-05-03 ENCOUNTER — Ambulatory Visit: Payer: Self-pay | Admitting: Cardiovascular Disease

## 2024-05-03 NOTE — Addendum Note (Signed)
 Encounter addended by: Gleen Sharper, RT-CT on: 05/03/2024 2:53 PM  Actions taken: Imaging Exam ended

## 2024-05-03 NOTE — Addendum Note (Signed)
 Encounter addended by: Gleen Sharper, RT-CT on: 05/03/2024 2:51 PM  Actions taken: Imaging Exam ended

## 2024-05-04 NOTE — Progress Notes (Signed)
 Cardiology Office Note   Date:  05/13/2024  ID:  Leslie Willis, DOB 28-Jun-1941, MRN [id number] PCP: Shepard Ade, MD  [facility] Providers Cardiologist:  Annabella Scarce, MD   History of Present Illness Leslie Willis is a 83 y.o. female with a past medical history of HTN, HLD, hypothyroidism, GERD. Patient followed by Dr. Scarce and presents today for an outpatient follow up appointment   Patient first seen by cardiology in 2019 for evaluation of chest discomfort. She underwent echocardiogram 05/28/18 that showed EF 60-65%, no regional wall motion abnormalities, grade II DD. Coronary CTA 06/30/18 showed a coronary calcium score of 0, no evidence of CAD. Later, ETT in 08/2021 was a negative study.   Coronary CTA in 03/2024 showed a coronary calcium score of 28 (23rd percentile). Study was non diagnostic due to contrast extravasation during injection. Stress test in 04/2024 was a normal, low risk study. No evidence of ischemia or infarction. Echocardiogram 04/2024 showed EF 55-60%, no regional wall motion abnormalities, normal RV systolic function, no significant valvular abnormalities.  Cardiac monitor showed predominantly normal sinus rhythm, 2 episodes of SVT lasting up to 12 seconds. Rare PACs and PVCs.   Today, patient presents for follow-up appointment after her cardiac testing.  Reports that she has been doing well, denies specific cardiac concerns or complaints.  We discussed the results of her studies, she does not have any questions.  She continues to have occasional fluttering in her chest.  Notes that this usually occurs after she rolls over in bed.  Symptoms are fairly mild.  We discussed possibly transitioning from amlodipine  to metoprolol  but patient does not want make any medication changes at this time.  Occasionally gets a gurgle in her chest. This is rare and brief.  she continues to have some chest discomfort on exertion.  Reports that this has been going on for  years and has been fairly stable over time.  Feels like if she walks faster than she normally does or if she is walking up a hill, she gets some mild chest pressure and numbness in her left arm.  Also gets a bit short of breath.  Symptoms resolve quickly with rest.  She walks about a mile per day.   Studies Reviewed Cardiac Studies & Procedures   ______________________________________________________________________________________________   STRESS TESTS  MYOCARDIAL PERFUSION IMAGING 04/28/2024  Interpretation Summary   A pharmacological stress test was performed using IV Lexiscan  0.4mg  over 10 seconds performed without concurrent submaximal exercise. The patient reported dyspnea and fatigue during the stress test. Normal blood pressure and normal heart rate response noted during stress. Heart rate recovery was normal.   During infusion there was <0.29mm down sloping ST depression in the inferior and inferolateral leads (II, III, aVF, V5 and V6) was noted. The ECG was not diagnostic due to pharmacologic protocol.   LV perfusion is normal. There is no evidence of ischemia. There is no evidence of infarction.   Left ventricular function is normal. Nuclear stress EF: 94%. End diastolic cavity size is normal. End systolic cavity size is normal. No evidence of transient ischemic dilation (TID) noted.   Coronary calcium was absent on the attenuation correction CT images.   Prior study not available for comparison.   The study is normal. The study is low risk.   ECHOCARDIOGRAM  ECHOCARDIOGRAM COMPLETE 04/27/2024  Narrative ECHOCARDIOGRAM REPORT    Patient Name:   Leslie Willis Date of Exam: 04/27/2024 Medical Rec #:  [id number]  Height:       63.0 in Accession #:    [id number]        Weight:       139.8 lb Date of Birth:  June 10, 1941         BSA:          1.661 m Patient Age:    82 years          BP:           110/60 mmHg Patient Gender: F                 HR:           88 bpm. Exam  Location:  Outpatient  Procedure: 2D Echo, 3D Echo, Cardiac Doppler, Color Doppler and Strain Analysis (Both Spectral and Color Flow Doppler were utilized during procedure).  Indications:    Chest pain  History:        Patient has prior history of Echocardiogram examinations, most recent 05/28/2018. Signs/Symptoms:Chest Pain; Risk Factors:Non-Smoker and Dyslipidemia. Diastolic Dysfunction.  Sonographer:    Orvil Holmes RDCS Referring Phys: [id number] TIFFANY [place]  IMPRESSIONS   1. Left ventricular ejection fraction, by estimation, is 55 to 60%. The left ventricle has normal function. The left ventricle has no regional wall motion abnormalities. Left ventricular diastolic parameters were normal. The average left ventricular global longitudinal strain is -20.3 %. The global longitudinal strain is normal. 2. Right ventricular systolic function is normal. The right ventricular size is normal. 3. Right atrial size was mildly dilated. 4. The mitral valve is normal in structure. Trivial mitral valve regurgitation. No evidence of mitral stenosis. 5. The aortic valve is tricuspid. There is mild calcification of the aortic valve. There is mild thickening of the aortic valve. Aortic valve regurgitation is not visualized. Aortic valve sclerosis is present, with no evidence of aortic valve stenosis. 6. The inferior vena cava is normal in size with greater than 50% respiratory variability, suggesting right atrial pressure of 3 mmHg.  FINDINGS Left Ventricle: Left ventricular ejection fraction, by estimation, is 55 to 60%. The left ventricle has normal function. The left ventricle has no regional wall motion abnormalities. The average left ventricular global longitudinal strain is -20.3 %. Strain was performed and the global longitudinal strain is normal. The left ventricular internal cavity size was normal in size. There is no left ventricular hypertrophy. Left ventricular diastolic parameters were  normal.  Right Ventricle: The right ventricular size is normal. No increase in right ventricular wall thickness. Right ventricular systolic function is normal.  Left Atrium: Left atrial size was normal in size.  Right Atrium: Right atrial size was mildly dilated.  Pericardium: There is no evidence of pericardial effusion.  Mitral Valve: The mitral valve is normal in structure. Trivial mitral valve regurgitation. No evidence of mitral valve stenosis.  Tricuspid Valve: The tricuspid valve is normal in structure. Tricuspid valve regurgitation is mild . No evidence of tricuspid stenosis.  Aortic Valve: The aortic valve is tricuspid. There is mild calcification of the aortic valve. There is mild thickening of the aortic valve. Aortic valve regurgitation is not visualized. Aortic valve sclerosis is present, with no evidence of aortic valve stenosis.  Pulmonic Valve: The pulmonic valve was normal in structure. Pulmonic valve regurgitation is mild. No evidence of pulmonic stenosis.  Aorta: The aortic root is normal in size and structure.  Venous: The inferior vena cava is normal in size with greater than 50% respiratory variability, suggesting right atrial pressure of  3 mmHg.  IAS/Shunts: No atrial level shunt detected by color flow Doppler.  Additional Comments: 3D was performed not requiring image post processing on an independent workstation and was normal.   LEFT VENTRICLE PLAX 2D LVIDd:         3.77 cm   Diastology LVIDs:         2.01 cm   LV e' medial:    5.77 cm/s LV PW:         0.98 cm   LV E/e' medial:  10.5 LV IVS:        0.83 cm   LV e' lateral:   5.00 cm/s LVOT diam:     1.90 cm   LV E/e' lateral: 12.1 LV SV:         52 LV SV Index:   31        2D Longitudinal Strain LVOT Area:     2.84 cm  2D Strain GLS Avg:     -20.3 %  3D Volume EF: 3D EF:        58 % LV EDV:       85 ml LV ESV:       36 ml LV SV:        50 ml  RIGHT VENTRICLE RV Basal diam:  3.51 cm RV Mid  diam:    2.78 cm RV S prime:     11.20 cm/s TAPSE (M-mode): 2.2 cm  LEFT ATRIUM             Index        RIGHT ATRIUM           Index LA diam:        2.60 cm 1.57 cm/m   RA Area:     14.90 cm LA Vol (A2C):   39.9 ml 24.03 ml/m  RA Volume:   38.50 ml  23.18 ml/m LA Vol (A4C):   22.5 ml 13.55 ml/m LA Biplane Vol: 30.7 ml 18.49 ml/m AORTIC VALVE LVOT Vmax:   82.30 cm/s LVOT Vmean:  52.900 cm/s LVOT VTI:    0.184 m  AORTA Ao Root diam: 3.30 cm Ao Asc diam:  3.00 cm  MITRAL VALVE               TRICUSPID VALVE MV Area (PHT): 3.27 cm    TR Peak grad:   23.4 mmHg MV Decel Time: 232 msec    TR Vmax:        242.00 cm/s MV E velocity: 60.30 cm/s MV A velocity: 47.70 cm/s  SHUNTS MV E/A ratio:  1.26        Systemic VTI:  0.18 m Systemic Diam: 1.90 cm  Maude Emmer MD Electronically signed by Maude Emmer MD Signature Date/Time: 04/27/2024/11:43:12 AM    Final    MONITORS  LONG TERM MONITOR (3-14 DAYS) 04/19/2024  Narrative 3 Day Zio Monitor  Quality: Fair.  Baseline artifact. Predominant rhythm: sinus rhythm Average heart rate: 68 bpm Max heart rate: 116 bpm in sinus rhythm.  169 bpm in SVT. Min heart rate: 45 bpm Pauses >2.5 seconds: none 2 episodes of SVT lasting up to 12 seconds.  Rare (<1%) PACs and PVCs.  Tiffany C. Raford, MD, [facility] 04/28/2024 2:46 PM   CT SCANS  CT CORONARY MORPH W/CTA COR W/SCORE 04/07/2024  Addendum 04/11/2024  8:40 AM ADDENDUM REPORT: 04/11/2024 08:37  EXAM: OVER-READ INTERPRETATION  CT CHEST  The following report is an over-read performed by radiologist Dr. Fonda Mom [facility] on 04/11/2024. This over-read does not include interpretation of cardiac or coronary anatomy or pathology. The coronary CTA interpretation by the cardiologist is attached.  COMPARISON:  06/30/2018.  FINDINGS: Cardiovascular: See findings discussed in the body of the report. Trace pericardial effusion or pericardial  thickening.  Mediastinum/Nodes: No suspicious adenopathy identified. Imaged mediastinal structures are unremarkable.  Lungs/Pleura: Imaged lungs are clear. No pleural effusion or pneumothorax.  Upper Abdomen: No acute abnormality.  Musculoskeletal: No chest wall abnormality. No acute osseous findings.  IMPRESSION: No acute extracardiac incidental findings.   Electronically Signed By: Fonda Field M.D. On: 04/11/2024 08:37  Narrative CLINICAL DATA:  Chest pain  EXAM: Cardiac/Coronary CTA  TECHNIQUE: A non-contrast, gated CT scan was obtained with axial slices of 2.5 mm through the heart for calcium scoring. Calcium scoring was performed using the Agatston method. A 120 kV prospective, gated, contrast cardiac CT scan was obtained. Gantry rotation speed was 230 msec and collimation was 0.63 mm. Two sublingual nitroglycerin  tablets (0.8 mg) were given. The 3D data set was reconstructed with motion correction for the best systolic or diastolic phase. Images were analyzed on a dedicated workstation using MPR, MIP, and VRT modes. The patient received 95 cc of contrast.  FINDINGS: Image quality: Very poor  Noise artifact is: Limited.  Coronary Arteries:  Normal coronary origin.  Right dominance.  Contrast extravasation occurred during injection, resulting in very poor contrast opacification in coronary arteries (Hounsfield units less than 200). Study is nondiagnostic and will need to be repeated  Right Atrium: Right atrial size is within normal limits.  Right Ventricle: The right ventricular cavity is within normal limits.  Left Atrium: Left atrial size is normal in size with no left atrial appendage filling defect.  Left Ventricle: The ventricular cavity size is within normal limits.  Pulmonary arteries: Normal in size.  Pulmonary veins: Normal pulmonary venous drainage.  Pericardium: Normal thickness without significant effusion or calcium  present.  Cardiac valves: The aortic valve is trileaflet without significant calcification. The mitral valve is normal without significant calcification.  Aorta: Normal caliber without significant disease.  Extra-cardiac findings: See attached radiology report for non-cardiac structures.  IMPRESSION: 1. Coronary calcium score of 28. This was 23rd percentile for age-, sex, and race-matched controls.  2. Normal coronary origin with right dominance.  3. Contrast extravasation occurred during injection, resulting in very poor contrast opacification in coronary arteries (Hounsfield units less than 200). Study is nondiagnostic and will need to be repeated  RECOMMENDATIONS: CAD-RADS N: Non-diagnostic study. Obstructive CAD can't be excluded. Alternative evaluation is recommended.  Electronically Signed: By: Lonni Nanas M.D. On: 04/08/2024 12:27   CT SCANS  CT CORONARY MORPH W/CTA COR W/SCORE 06/30/2018  Addendum 07/01/2018  2:28 PM ADDENDUM REPORT: 06/30/2018 15:39  CLINICAL DATA:  67F with atypical chest pain.  EXAM: Cardiac/Coronary  CT  TECHNIQUE: The patient was scanned on a [employer].  FINDINGS: A 120 kV prospective scan was triggered in the descending thoracic aorta at 111 HU's. Axial non-contrast 3 mm slices were carried out through the heart. The data set was analyzed on a dedicated work station and scored using the Agatson method. Gantry rotation speed was 250 msecs and collimation was .6 mm. No beta blockade and 0.8 mg of sl NTG was given. The 3D data set was reconstructed in 5% intervals of the 67-82 % of the R-R cycle. Diastolic phases were analyzed on a dedicated work station using MPR, MIP and VRT modes. The patient  received 80 cc of contrast.  Aorta:  Normal size.  No calcifications.  No dissection.  Aortic Valve:  Trileaflet.  No calcifications.  Coronary Arteries:  Normal coronary origin.  Right dominance.  RCA is a large  dominant artery that gives rise to PDA and PLVB. There is no plaque.  Left main is a large artery that gives rise to LAD and LCX arteries. There is no plaque.  LAD is a large vessel that gives rise to a large, branching D1 and a normal size D2. It has no plaque.  LCX is a small, non-dominant artery.  There is no plaque.  Other findings:  Normal pulmonary vein drainage into the left atrium.  Normal let atrial appendage without a thrombus.  Normal size of the pulmonary artery.  IMPRESSION: 1. Coronary calcium score of 0. This was 0 percentile for age and sex matched control.  2. Normal coronary origin with right dominance.  3. No evidence of CAD.  4.  Consider non-cardiac causes of chest pain.  Annabella Scarce, MD   Electronically Signed By: Annabella Scarce On: 06/30/2018 15:39  Narrative EXAM: OVER-READ INTERPRETATION  CT CHEST  The following report is an over-read performed by radiologist Dr. Franky Crease of [facility] on 06/30/2018. This over-read does not include interpretation of cardiac or coronary anatomy or pathology. The coronary CTA interpretation by the cardiologist is attached.  COMPARISON:  None.  FINDINGS: Vascular: Heart is normal size.  Visualized aorta is normal caliber.  Mediastinum/Nodes: No adenopathy in the lower mediastinum or hila.  Lungs/Pleura: Visualized lungs clear.  No effusions.  Upper Abdomen: Imaging into the upper abdomen shows no acute findings.  Musculoskeletal: Chest wall soft tissues are unremarkable. No acute bony abnormality.  IMPRESSION: No acute or significant extracardiac abnormality.  Electronically Signed: By: Franky Crease M.D. On: 06/30/2018 12:18     ______________________________________________________________________________________________       Risk Assessment/Calculations           Physical Exam VS:  BP 100/64   Pulse 71   Ht 5' 3 (1.6 m)   Wt 139 lb (63 kg)   SpO2 97%    BMI 24.62 kg/m        Wt Readings from Last 3 Encounters:  05/13/24 139 lb (63 kg)  04/28/24 139 lb (63 kg)  03/29/24 139 lb 12.8 oz (63.4 kg)    GEN: Well nourished, well developed in no acute distress. Sitting comfortably in the chair  NECK: No JVD  CARDIAC:  RRR, no murmurs, rubs, gallops. Radial pulses 2+ bilaterally  RESPIRATORY:  Clear to auscultation without rales, wheezing or rhonchi. Normal WOB on room air   ABDOMEN: Soft, non-tender, non-distended EXTREMITIES:  No edema in BLE; No deformity   ASSESSMENT AND PLAN  Chest pain  - Coronary CTA in 03/2024 showed a coronary calcium score of 28 (23rd percentile). Study was nondiagnostic. Stress test in 04/2024 was a normal, low risk study and did not show any coronary calcifications   - Echo in 04/2024 showed EF 55-60%, no wall motion abnormalities, normal RV systolic function - Today patient reports that she has continued to have some chest pressure on exertion.  Reports that this has been going on for years and has been stable over time.  She also has some left arm numbness with this.  We discussed her reassuring stress test and echocardiogram.  Discussed possible noncardiac causes of chest discomfort including GERD or thoracic spine radicular pain - Encouraged her to continue  to exercise as tolerated, continue simvastatin 10 mg daily   Palpitations  SVT  - Cardiac monitor in 04/2024 showed 2 episodes of SVT lasting up to 12 seconds - Dr. Raford offered transitioning from amlodipine  to metoprolol  for palpitations, but patient did not want to make any medication adjustments at that time - Today patient reports that her palpitations are usually pretty mild.  Usually occur when she rolls over in bed.  Again offered beta-blocker but patient declined  Shortness of breath  - Echocardiogram 04/27/24 showed EF 55-60%, no regional wall motion abnormalities, normal RV systolic function, no significant valvular abnormalities  - Patient has  some shortness of breath on exertion but recovers well when she rests. No significant SOB at rest. As long as she walks slow, breathing is stable  - She does have history of asthma which may be contributing. Following up with PCP   HTN  - BP well controlled, on the lower end of normal so could not start BB without stopping amlodipine . Patient prefers to continue current medications  - Continue amlodipine  2.5 mg daily   HLD  - LDL 93 in 08/2023  - Continue simvastatin 10 mg daily   Dispo: Follow-up with Dr. Raford in 12 months  Signed, Rollo FABIENE Louder, PA-C

## 2024-05-13 ENCOUNTER — Encounter: Payer: Self-pay | Admitting: Cardiology

## 2024-05-13 ENCOUNTER — Ambulatory Visit: Attending: Cardiology | Admitting: Cardiology

## 2024-05-13 VITALS — BP 100/64 | HR 71 | Ht 63.0 in | Wt 139.0 lb

## 2024-05-13 DIAGNOSIS — R072 Precordial pain: Secondary | ICD-10-CM | POA: Diagnosis not present

## 2024-05-13 DIAGNOSIS — R002 Palpitations: Secondary | ICD-10-CM | POA: Insufficient documentation

## 2024-05-13 DIAGNOSIS — E7800 Pure hypercholesterolemia, unspecified: Secondary | ICD-10-CM | POA: Insufficient documentation

## 2024-05-13 NOTE — Patient Instructions (Signed)
 Medication Instructions:  No changes *If you need a refill on your cardiac medications before your next appointment, please call your pharmacy*  Lab Work: No labs  Testing/Procedures: No testing  Follow-Up: At [facility], you and your health needs are our priority.  As part of our continuing mission to provide you with exceptional heart care, our providers are all part of one team.  This team includes your primary Cardiologist (physician) and Advanced Practice Providers or APPs (Physician Assistants and Nurse Practitioners) who all work together to provide you with the care you need, when you need it.  Your next appointment:   1 year(s)  Provider:   Annabella Scarce, MD    We recommend signing up for the patient portal called MyChart.  Sign up information is provided on this After Visit Summary.  MyChart is used to connect with patients for Virtual Visits (Telemedicine).  Patients are able to view lab/test results, encounter notes, upcoming appointments, etc.  Non-urgent messages can be sent to your provider as well.   To learn more about what you can do with MyChart, go to [email or web address].

## 2024-05-24 ENCOUNTER — Encounter (HOSPITAL_BASED_OUTPATIENT_CLINIC_OR_DEPARTMENT_OTHER): Payer: Self-pay | Admitting: Cardiovascular Disease

## 2024-05-24 DIAGNOSIS — R42 Dizziness and giddiness: Secondary | ICD-10-CM

## 2024-05-24 NOTE — Telephone Encounter (Signed)
 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  The dizziness- she reports it starting today. Just having bouts of feeling dizzy while just sitting.   She said that she talked with a friend- and the friend's husband had blind spot in eye and it was due to carotid blockage.   So, she is just worried/wondering if this eye problem is maybe related to her carotid? She has been seen for a while by eye doctor and wondering if it is related to anything else.    Informed her that I would send this information to her provider and see what they recommend.

## 2024-05-31 ENCOUNTER — Ambulatory Visit (HOSPITAL_COMMUNITY)
Admission: RE | Admit: 2024-05-31 | Discharge: 2024-05-31 | Disposition: A | Discharge status: Home / Self Care | Source: Ambulatory Visit | Attending: Cardiology | Admitting: Cardiology

## 2024-05-31 DIAGNOSIS — R42 Dizziness and giddiness: Secondary | ICD-10-CM | POA: Insufficient documentation

## 2024-06-02 ENCOUNTER — Ambulatory Visit: Payer: Self-pay | Admitting: Cardiology

## 2024-06-10 DIAGNOSIS — J302 Other seasonal allergic rhinitis: Secondary | ICD-10-CM | POA: Diagnosis not present

## 2024-06-10 DIAGNOSIS — K219 Gastro-esophageal reflux disease without esophagitis: Secondary | ICD-10-CM | POA: Diagnosis not present

## 2024-06-10 DIAGNOSIS — J4521 Mild intermittent asthma with (acute) exacerbation: Secondary | ICD-10-CM | POA: Diagnosis not present

## 2024-06-10 DIAGNOSIS — R5383 Other fatigue: Secondary | ICD-10-CM | POA: Diagnosis not present

## 2024-06-10 DIAGNOSIS — J019 Acute sinusitis, unspecified: Secondary | ICD-10-CM | POA: Diagnosis not present

## 2024-06-10 DIAGNOSIS — J029 Acute pharyngitis, unspecified: Secondary | ICD-10-CM | POA: Diagnosis not present

## 2024-06-10 DIAGNOSIS — R0989 Other specified symptoms and signs involving the circulatory and respiratory systems: Secondary | ICD-10-CM | POA: Diagnosis not present

## 2024-06-10 DIAGNOSIS — Z1152 Encounter for screening for COVID-19: Secondary | ICD-10-CM | POA: Diagnosis not present

## 2024-06-11 NOTE — Telephone Encounter (Signed)
 Called patient advised of below they verbalized understanding.

## 2024-06-11 NOTE — Telephone Encounter (Signed)
-----   Message from Rollo FABIENE Louder sent at 06/02/2024  7:54 AM EDT ----- Good Morning  Please tell patient that her carotid ultrasounds were normal with only minimal wall thickening/plaque bilaterally. This is great news!  Thanks  KJ  ----- Message ----- From: Interface, Three One Seven Sent: 05/31/2024  10:33 AM EDT To: Rollo JONELLE Louder, PA-C

## 2024-06-15 DIAGNOSIS — H04123 Dry eye syndrome of bilateral lacrimal glands: Secondary | ICD-10-CM | POA: Diagnosis not present

## 2024-06-15 DIAGNOSIS — H26491 Other secondary cataract, right eye: Secondary | ICD-10-CM | POA: Diagnosis not present

## 2024-06-15 DIAGNOSIS — H401131 Primary open-angle glaucoma, bilateral, mild stage: Secondary | ICD-10-CM | POA: Diagnosis not present

## 2024-06-21 ENCOUNTER — Other Ambulatory Visit: Payer: Self-pay | Admitting: Internal Medicine

## 2024-06-21 DIAGNOSIS — Z1231 Encounter for screening mammogram for malignant neoplasm of breast: Secondary | ICD-10-CM

## 2024-07-12 DIAGNOSIS — Z23 Encounter for immunization: Secondary | ICD-10-CM | POA: Diagnosis not present

## 2024-08-04 ENCOUNTER — Ambulatory Visit
Admission: RE | Admit: 2024-08-04 | Discharge: 2024-08-04 | Disposition: A | Discharge status: Home / Self Care | Source: Ambulatory Visit | Attending: Internal Medicine | Admitting: Internal Medicine

## 2024-08-04 DIAGNOSIS — Z1231 Encounter for screening mammogram for malignant neoplasm of breast: Secondary | ICD-10-CM | POA: Diagnosis not present

## 2024-08-20 DIAGNOSIS — H04123 Dry eye syndrome of bilateral lacrimal glands: Secondary | ICD-10-CM | POA: Diagnosis not present

## 2024-08-20 DIAGNOSIS — H35372 Puckering of macula, left eye: Secondary | ICD-10-CM | POA: Diagnosis not present

## 2024-08-20 DIAGNOSIS — H524 Presbyopia: Secondary | ICD-10-CM | POA: Diagnosis not present

## 2024-08-20 DIAGNOSIS — H26491 Other secondary cataract, right eye: Secondary | ICD-10-CM | POA: Diagnosis not present

## 2024-09-06 DIAGNOSIS — R82998 Other abnormal findings in urine: Secondary | ICD-10-CM | POA: Diagnosis not present

## 2024-11-02 ENCOUNTER — Emergency Department (HOSPITAL_BASED_OUTPATIENT_CLINIC_OR_DEPARTMENT_OTHER)
Admission: EM | Admit: 2024-11-02 | Discharge: 2024-11-03 | Disposition: A | Discharge status: Home / Self Care | Attending: Emergency Medicine | Admitting: Emergency Medicine

## 2024-11-02 ENCOUNTER — Emergency Department (HOSPITAL_BASED_OUTPATIENT_CLINIC_OR_DEPARTMENT_OTHER)

## 2024-11-02 ENCOUNTER — Emergency Department (HOSPITAL_COMMUNITY)

## 2024-11-02 ENCOUNTER — Other Ambulatory Visit: Payer: Self-pay

## 2024-11-02 ENCOUNTER — Encounter (HOSPITAL_BASED_OUTPATIENT_CLINIC_OR_DEPARTMENT_OTHER): Payer: Self-pay | Admitting: Emergency Medicine

## 2024-11-02 DIAGNOSIS — R9431 Abnormal electrocardiogram [ECG] [EKG]: Secondary | ICD-10-CM | POA: Insufficient documentation

## 2024-11-02 DIAGNOSIS — G319 Degenerative disease of nervous system, unspecified: Secondary | ICD-10-CM | POA: Insufficient documentation

## 2024-11-02 DIAGNOSIS — E039 Hypothyroidism, unspecified: Secondary | ICD-10-CM | POA: Insufficient documentation

## 2024-11-02 DIAGNOSIS — R42 Dizziness and giddiness: Secondary | ICD-10-CM | POA: Insufficient documentation

## 2024-11-02 DIAGNOSIS — R269 Unspecified abnormalities of gait and mobility: Secondary | ICD-10-CM | POA: Insufficient documentation

## 2024-11-02 DIAGNOSIS — I10 Essential (primary) hypertension: Secondary | ICD-10-CM | POA: Insufficient documentation

## 2024-11-02 LAB — DIFFERENTIAL
Abs Immature Granulocytes: 0.02 K/uL (ref 0.00–0.07)
Basophils Absolute: 0 K/uL (ref 0.0–0.1)
Basophils Relative: 0 %
Eosinophils Absolute: 0.2 K/uL (ref 0.0–0.5)
Eosinophils Relative: 2 %
Immature Granulocytes: 0 %
Lymphocytes Relative: 31 %
Lymphs Abs: 2 K/uL (ref 0.7–4.0)
Monocytes Absolute: 0.4 K/uL (ref 0.1–1.0)
Monocytes Relative: 6 %
Neutro Abs: 3.7 K/uL (ref 1.7–7.7)
Neutrophils Relative %: 61 %

## 2024-11-02 LAB — COMPREHENSIVE METABOLIC PANEL WITH GFR
ALT: 19 U/L (ref 0–44)
AST: 20 U/L (ref 15–41)
Albumin: 4.6 g/dL (ref 3.5–5.0)
Alkaline Phosphatase: 82 U/L (ref 38–126)
Anion gap: 10 (ref 5–15)
BUN: 17 mg/dL (ref 8–23)
CO2: 27 mmol/L (ref 22–32)
Calcium: 9.6 mg/dL (ref 8.9–10.3)
Chloride: 106 mmol/L (ref 98–111)
Creatinine, Ser: 0.88 mg/dL (ref 0.44–1.00)
GFR, Estimated: >60 mL/min
Glucose, Bld: 96 mg/dL (ref 70–99)
Potassium: 4.2 mmol/L (ref 3.5–5.1)
Sodium: 143 mmol/L (ref 135–145)
Total Bilirubin: 0.3 mg/dL (ref 0.0–1.2)
Total Protein: 6.9 g/dL (ref 6.5–8.1)

## 2024-11-02 LAB — CBC
HCT: 37.6 % (ref 36.0–46.0)
Hemoglobin: 12.6 g/dL (ref 12.0–15.0)
MCH: 30.8 pg (ref 26.0–34.0)
MCHC: 33.5 g/dL (ref 30.0–36.0)
MCV: 91.9 fL (ref 80.0–100.0)
Platelets: 200 K/uL (ref 150–400)
RBC: 4.09 MIL/uL (ref 3.87–5.11)
RDW: 14.6 % (ref 11.5–15.5)
WBC: 6.3 K/uL (ref 4.0–10.5)
nRBC: 0 % (ref 0.0–0.2)

## 2024-11-02 LAB — APTT: aPTT: 34 s (ref 24–36)

## 2024-11-02 LAB — PROTIME-INR
INR: 0.9 (ref 0.8–1.2)
Prothrombin Time: 12.7 s (ref 11.4–15.2)

## 2024-11-02 LAB — ETHANOL: Alcohol, Ethyl (B): <15 mg/dL

## 2024-11-02 MED ORDER — MECLIZINE HCL 25 MG PO TABS
25.0000 mg | ORAL_TABLET | Freq: Once | ORAL | Status: AC
  Administered 2024-11-02: 25 mg via ORAL
  Filled 2024-11-02: qty 1

## 2024-11-02 MED ORDER — SODIUM CHLORIDE 0.9% FLUSH: 3.0000 mL | Freq: Once | INTRAVENOUS | Status: DC

## 2024-11-02 NOTE — ED Provider Notes (Signed)
 " [place] EMERGENCY DEPARTMENT AT [facility] Provider Note   CSN: [id number] Arrival date & time: 11/02/24  1747     Patient presents with: Dizziness   Leslie Willis is a 84 y.o. female.   Patient is an 84 year old female with a past medical history of hypertension, hyperlipidemia and hypothyroidism that presented to the emergency department with dizziness.  Patient states that around noon today Leslie Willis got done with lunch and when Leslie Willis stood up from the table Leslie Willis started to feel dizzy.  Leslie Willis states that Leslie Willis felt off balance like Leslie Willis was drunk and a little bit like Leslie Willis was spinning.  Leslie Willis states that Leslie Willis feels off balance when Leslie Willis tries to walk.  Leslie Willis denied any associated nausea or vomiting.  Leslie Willis states that her symptoms have been constant since it started.  Leslie Willis denies any numbness or weakness, vision changes.  Leslie Willis states that Leslie Willis has had similar symptoms that last only about 30 minutes but states that this is the first time anything like this has been persistent.  The history is provided by the patient.  Dizziness      Prior to Admission medications  Medication Sig Start Date End Date Taking? Authorizing Provider  amLODipine  (NORVASC ) 2.5 MG tablet Take 1 tablet (2.5 mg total) by mouth daily. 08/28/23 05/13/24  Cindie Delon POUR, PA-C  cholecalciferol (VITAMIN D) 1000 units tablet Take 1,000 Units by mouth daily.    [provider]  co-enzyme Q-10 30 MG capsule Take 100 mg by mouth daily.    [provider]  Diclofenac Sodium CR 100 MG 24 hr tablet Take 1 tablet by mouth daily. 05/01/18   [provider]  Ginkgo Biloba 40 MG TABS Take by mouth.    [provider]  levocetirizine (XYZAL) 5 MG tablet Take 1 tablet (5 mg total) by mouth every evening. 01/25/20   Maxie Herlene POUR, PA-C  levothyroxine (SYNTHROID) 100 MCG tablet Take 100 mcg by mouth daily. 06/02/21   [provider]  Multiple Vitamins-Minerals [facility] E[facility] HE[facility] FORMULA)  CAPS Take 1 daily 01/25/20   Kilroy, Luke K, PA-C  Omega-3 Fatty Acids (FISH OIL) 1000 MG CAPS Take 1 capsule by mouth daily.    [provider]  RESTASIS 0.05 % ophthalmic emulsion Place 1 drop into both eyes 2 (two) times daily. 12/05/22   [provider]  simvastatin (ZOCOR) 10 MG tablet Take 10 mg by mouth at bedtime. 04/09/18   [provider]    Allergies: Sulfa antibiotics    Review of Systems  Neurological:  Positive for dizziness.    Updated Vital Signs BP 125/63   Pulse 63   Temp 98.9 F (37.2 C)   Resp (!) 24   SpO2 99%   Physical Exam Vitals and nursing note reviewed.  Constitutional:      General: Leslie Willis is not in acute distress.    Appearance: Normal appearance.  HENT:     Head: Normocephalic and atraumatic.     Nose: Nose normal.     Mouth/Throat:     Mouth: Mucous membranes are moist.     Pharynx: Oropharynx is clear.  Eyes:     Extraocular Movements: Extraocular movements intact.     Conjunctiva/sclera: Conjunctivae normal.     Pupils: Pupils are equal, round, and reactive to light.     Comments: No nystagmus  Cardiovascular:     Rate and Rhythm: Normal rate and regular rhythm.     Heart sounds: Normal heart  sounds.  Pulmonary:     Effort: Pulmonary effort is normal.     Breath sounds: Normal breath sounds.  Abdominal:     General: Abdomen is flat.     Palpations: Abdomen is soft.     Tenderness: There is no abdominal tenderness.  Musculoskeletal:        General: Normal range of motion.     Cervical back: Normal range of motion and neck supple.  Skin:    General: Skin is warm and dry.  Neurological:     General: No focal deficit present.     Mental Status: Leslie Willis is alert and oriented to person, place, and time.     Cranial Nerves: No cranial nerve deficit.     Sensory: No sensory deficit.     Motor: No weakness.     Coordination: Coordination normal (Normal finger to nose and heel to shin bilaterally).     Gait: Gait  abnormal (unsteady gait).  Psychiatric:        Mood and Affect: Mood normal.        Behavior: Behavior normal.     (all labs ordered are listed, but only abnormal results are displayed) Labs Reviewed  PROTIME-INR  APTT  CBC  DIFFERENTIAL  COMPREHENSIVE METABOLIC PANEL WITH GFR  ETHANOL  CBG MONITORING, ED    EKG: EKG Interpretation Date/Time:  Tuesday November 02 2024 17:56:34 EST Ventricular Rate:  62 PR Interval:  150 QRS Duration:  88 QT Interval:  420 QTC Calculation: 426 R Axis:   66  Text Interpretation: Normal sinus rhythm Low voltage QRS Nonspecific ST abnormality Abnormal ECG When compared with ECG of 28-Aug-2023 10:35, No significant change was found Confirmed by Ellouise Fine ([id number]) on 11/02/2024 6:27:15 PM  Radiology: CT Head Wo Contrast Result Date: 11/02/2024 CLINICAL DATA:  Dizziness. EXAM: CT HEAD WITHOUT CONTRAST TECHNIQUE: Contiguous axial images were obtained from the base of the skull through the vertex without intravenous contrast. RADIATION DOSE REDUCTION: This exam was performed according to the departmental dose-optimization program which includes automated exposure control, adjustment of the mA and/or kV according to patient size and/or use of iterative reconstruction technique. COMPARISON:  None Available. FINDINGS: Brain: There is generalized cerebral atrophy with widening of the extra-axial spaces and ventricular dilatation. There are areas of decreased attenuation within the white matter tracts of the supratentorial brain, consistent with microvascular disease changes. Vascular: No hyperdense vessel or unexpected calcification. Skull: Normal. Negative for fracture or focal lesion. Sinuses/Orbits: There is a small left maxillary sinus air-fluid level. Other: None. IMPRESSION: 1. Generalized cerebral atrophy and microvascular disease changes of the supratentorial brain. 2. No acute intracranial abnormality. 3. Small left maxillary sinus air-fluid level.  Electronically Signed   By: Suzen Dials M.D.   On: 11/02/2024 18:48     Procedures   Medications Ordered in the ED  sodium chloride  flush (NS) 0.9 % injection 3 mL (3 mLs Intravenous Not Given 11/02/24 1756)  meclizine  (ANTIVERT ) tablet 25 mg (25 mg Oral Given 11/02/24 1921)    Clinical Course as of 11/02/24 2108  Tue Nov 02, 2024  2051 Labs within normal range and no acute findings on CTH. With patient's age and CVA risk factors, will recommend ED to ED transfer for MRI.  [VK]  2107 Dr. Pamella is accepting for ED to ED transfer.  [VK]    Clinical Course User Index [VK] Kingsley, Azuri Bozard K, DO  Medical Decision Making This patient presents to the ED with chief complaint(s) of dizziness with pertinent past medical history of HTN, HLD, hypothyroid which further complicates the presenting complaint. The complaint involves an extensive differential diagnosis and also carries with it a high risk of complications and morbidity.    The differential diagnosis includes CVA, TIA, peripheral versus central vertigo, arrhythmia, anemia, dehydration, electrolyte abnormality, orthostatic hypotension  Additional history obtained: Additional history obtained from family Records reviewed Care Everywhere/External Records  ED Course and Reassessment: On patient's arrival Leslie Willis is hypertensive and otherwise hemodynamically stable in no acute distress.  Leslie Willis did have an unsteady gait but had no other neurologic deficits on exam and no notable nystagmus.  The patient had labs, EKG and head CT initiated in triage.  Head CT showed no acute disease.  EKG showed normal sinus rhythm.  Labs are pending at this time.  Leslie Willis was given meclizine  for symptomatic treatment and will be closely reassessed.  Independent labs interpretation:  The following labs were independently interpreted: within normal range  Independent visualization of imaging: - I independently visualized the  following imaging with scope of interpretation limited to determining acute life threatening conditions related to emergency care: CTH, which revealed no acute disease  Consultation: - Consulted or discussed management/test interpretation w/ external professional: N/A  Consideration for admission or further workup: patient requires transfer for MRI Social Determinants of health: N/A    Amount and/or Complexity of Data Reviewed Labs: ordered. Radiology: ordered.       Final diagnoses:  Dizziness    ED Discharge Orders     None          Ellouise Richerd POUR, [place] 11/02/24 2108  "

## 2024-11-02 NOTE — ED Triage Notes (Signed)
 Reports dizziness starting today around noon today. States symptoms are worse when moving head.

## 2024-11-02 NOTE — ED Notes (Signed)
 Pt taken for MRI.

## 2024-11-02 NOTE — ED Provider Notes (Incomplete)
" °  Accepted handoff at shift change from Dr. Ellouise. Please see prior provider note for more detail.   Briefly: Patient is 84 y.o. presented to the emergency department with dizziness. Patient states that around noon today she got done with lunch and when she stood up from the table she started to feel dizzy. She states that she felt off balance like she was drunk and a little bit like she was spinning. She states that she feels off balance when she tries to walk. She denied any associated nausea or vomiting. She states that her symptoms have been constant since it started. She denies any numbness or weakness, vision changes. She states that she has had similar symptoms that last only about 30 minutes but states that this is the first time anything like this has been persistent.  Plan:  - dispo pending MRI brain.  -CBC without leukocytosis or anemia.  CMP reassuring.  PT/INR/APTT within normal limits.  EtOH within normal limits.  CT head without acute abnormality. - Patient arrived to ED safely. States that dizziness resolved around 1 hour after receiving Meclizine . Patient ambulating with stable gait and is now asymptomatic. Patient monitored in ED for 4 more hours after arrival to [facility] ED without reoccurrence of dizziness.  EKG sinus rhythm. - MRI without acute process.  - Shared all results with patient.  Answered all questions.  It appears that patient was suffering from vertigo. Patient continues to be asymptomatic and is ready to go home.  Recommended following up with PCP in the next 48 to 72 hours.  Patient agreeable with plan.  I will prescribe patient a couple doses of meclizine  to help if symptoms recur. - Staffed with Dr. Midge. - The patient has been appropriately medically screened and/or stabilized in the ED. I have low suspicion for any other emergent medical condition which would require further screening, evaluation or treatment in the ED or require inpatient management. At time of  discharge the patient is hemodynamically stable and in no acute distress. I have discussed work-up results and diagnosis with patient and answered all questions. Patient is agreeable with discharge plan. We discussed strict return precautions for returning to the emergency department and they verbalized understanding.        Hoy Nidia FALCON, [place] 11/03/24 ROSLYNN    Midge Golas, MD 11/03/24 [phone]  "

## 2024-11-02 NOTE — ED Triage Notes (Signed)
 Patient transferred via POV for MRI from [facility]. A&Ox4 on arrival to [facility]. She denies any changes at this time.

## 2024-11-03 ENCOUNTER — Other Ambulatory Visit: Payer: Self-pay | Admitting: Cardiovascular Disease

## 2024-11-03 MED ORDER — MECLIZINE HCL 25 MG PO TABS: 25.0000 mg | ORAL_TABLET | Freq: Two times a day (BID) | ORAL | 0 refills | Status: AC | PRN

## 2024-11-03 NOTE — ED Notes (Signed)
 Pt back from MRI

## 2024-11-03 NOTE — Discharge Instructions (Signed)
 As discussed, please follow up with primary care. Seek emergency care if experiencing any new or worsening symptoms.

## 2024-11-08 MED ORDER — AMLODIPINE BESYLATE 2.5 MG PO TABS: 2.5000 mg | ORAL_TABLET | Freq: Every day | ORAL | 1 refills | Status: AC

## 2024-11-18 NOTE — Progress Notes (Signed)
 Labs ordered to evaluate for cause of symptoms
